# Patient Record
Sex: Male | Born: 1935 | Race: White | Hispanic: No | Smoking: Former smoker
Health system: Southern US, Community
[De-identification: ages and names within clinical notes are randomized; demographics above are authoritative.]

## PROBLEM LIST (undated history)

## (undated) DIAGNOSIS — R7303 Prediabetes: Secondary | ICD-10-CM

## (undated) DIAGNOSIS — G250 Essential tremor: Secondary | ICD-10-CM

## (undated) DIAGNOSIS — F32A Depression, unspecified: Secondary | ICD-10-CM

## (undated) DIAGNOSIS — G459 Transient cerebral ischemic attack, unspecified: Secondary | ICD-10-CM

## (undated) DIAGNOSIS — G4733 Obstructive sleep apnea (adult) (pediatric): Secondary | ICD-10-CM

## (undated) DIAGNOSIS — F329 Major depressive disorder, single episode, unspecified: Secondary | ICD-10-CM

## (undated) DIAGNOSIS — I10 Essential (primary) hypertension: Secondary | ICD-10-CM

## (undated) DIAGNOSIS — C4490 Unspecified malignant neoplasm of skin, unspecified: Secondary | ICD-10-CM

## (undated) DIAGNOSIS — F419 Anxiety disorder, unspecified: Secondary | ICD-10-CM

## (undated) DIAGNOSIS — K279 Peptic ulcer, site unspecified, unspecified as acute or chronic, without hemorrhage or perforation: Secondary | ICD-10-CM

## (undated) DIAGNOSIS — N400 Enlarged prostate without lower urinary tract symptoms: Secondary | ICD-10-CM

## (undated) DIAGNOSIS — F410 Panic disorder [episodic paroxysmal anxiety] without agoraphobia: Secondary | ICD-10-CM

## (undated) DIAGNOSIS — E039 Hypothyroidism, unspecified: Secondary | ICD-10-CM

## (undated) DIAGNOSIS — Z8673 Personal history of transient ischemic attack (TIA), and cerebral infarction without residual deficits: Secondary | ICD-10-CM

## (undated) DIAGNOSIS — R339 Retention of urine, unspecified: Secondary | ICD-10-CM

## (undated) DIAGNOSIS — E7800 Pure hypercholesterolemia, unspecified: Secondary | ICD-10-CM

## (undated) DIAGNOSIS — K219 Gastro-esophageal reflux disease without esophagitis: Secondary | ICD-10-CM

## (undated) DIAGNOSIS — I2510 Atherosclerotic heart disease of native coronary artery without angina pectoris: Secondary | ICD-10-CM

## (undated) DIAGNOSIS — K259 Gastric ulcer, unspecified as acute or chronic, without hemorrhage or perforation: Secondary | ICD-10-CM

## (undated) DIAGNOSIS — G4730 Sleep apnea, unspecified: Secondary | ICD-10-CM

## (undated) HISTORY — DX: Transient cerebral ischemic attack, unspecified: G45.9

## (undated) HISTORY — DX: Hypothyroidism, unspecified: E03.9

## (undated) HISTORY — DX: Essential (primary) hypertension: I10

## (undated) HISTORY — DX: Essential tremor: G25.0

## (undated) HISTORY — DX: Atherosclerotic heart disease of native coronary artery without angina pectoris: I25.10

## (undated) HISTORY — DX: Personal history of transient ischemic attack (TIA), and cerebral infarction without residual deficits: Z86.73

## (undated) HISTORY — DX: Major depressive disorder, single episode, unspecified: F32.9

## (undated) HISTORY — DX: Retention of urine, unspecified: R33.9

## (undated) HISTORY — PX: CORONARY ARTERY BYPASS GRAFT: SHX141

## (undated) HISTORY — DX: Anxiety disorder, unspecified: F41.9

## (undated) HISTORY — DX: Panic disorder (episodic paroxysmal anxiety): F41.0

## (undated) HISTORY — DX: Pure hypercholesterolemia, unspecified: E78.00

## (undated) HISTORY — DX: Depression, unspecified: F32.A

## (undated) HISTORY — DX: Sleep apnea, unspecified: G47.30

## (undated) HISTORY — PX: SQUAMOUS CELL CARCINOMA EXCISION: SHX2433

## (undated) HISTORY — DX: Benign prostatic hyperplasia without lower urinary tract symptoms: N40.0

## (undated) HISTORY — DX: Prediabetes: R73.03

## (undated) HISTORY — DX: Unspecified malignant neoplasm of skin, unspecified: C44.90

## (undated) HISTORY — DX: Peptic ulcer, site unspecified, unspecified as acute or chronic, without hemorrhage or perforation: K27.9

## (undated) HISTORY — DX: Obstructive sleep apnea (adult) (pediatric): G47.33

## (undated) HISTORY — PX: CAROTID ENDARTERECTOMY: SUR193

## (undated) HISTORY — DX: Gastric ulcer, unspecified as acute or chronic, without hemorrhage or perforation: K25.9

## (undated) HISTORY — DX: Gastro-esophageal reflux disease without esophagitis: K21.9

---

## 1975-11-16 HISTORY — PX: CARDIAC SURGERY: SHX584

## 2003-11-16 HISTORY — PX: CARDIAC SURGERY: SHX584

## 2005-12-17 ENCOUNTER — Ambulatory Visit: Payer: Self-pay

## 2006-04-18 ENCOUNTER — Ambulatory Visit: Payer: Self-pay | Admitting: Unknown Physician Specialty

## 2006-05-15 ENCOUNTER — Ambulatory Visit: Payer: Self-pay | Admitting: Unknown Physician Specialty

## 2006-06-15 ENCOUNTER — Ambulatory Visit: Payer: Self-pay | Admitting: Unknown Physician Specialty

## 2007-05-31 ENCOUNTER — Ambulatory Visit: Payer: Self-pay | Admitting: Cardiology

## 2008-05-14 ENCOUNTER — Ambulatory Visit: Payer: Self-pay | Admitting: Unknown Physician Specialty

## 2008-05-28 ENCOUNTER — Ambulatory Visit: Payer: Self-pay | Admitting: Unknown Physician Specialty

## 2008-06-04 ENCOUNTER — Ambulatory Visit: Payer: Self-pay | Admitting: Vascular Surgery

## 2008-07-03 ENCOUNTER — Ambulatory Visit: Payer: Self-pay | Admitting: Vascular Surgery

## 2008-07-10 ENCOUNTER — Inpatient Hospital Stay: Payer: Self-pay | Admitting: Vascular Surgery

## 2008-11-11 ENCOUNTER — Ambulatory Visit: Payer: Self-pay | Admitting: Unknown Physician Specialty

## 2008-11-11 HISTORY — PX: COLONOSCOPY: SHX174

## 2010-03-30 ENCOUNTER — Ambulatory Visit: Payer: Self-pay | Admitting: Unknown Physician Specialty

## 2013-05-07 ENCOUNTER — Encounter: Payer: Self-pay | Admitting: Family Medicine

## 2013-05-15 ENCOUNTER — Encounter: Payer: Self-pay | Admitting: Family Medicine

## 2013-06-15 ENCOUNTER — Encounter: Payer: Self-pay | Admitting: Family Medicine

## 2013-07-16 ENCOUNTER — Encounter: Payer: Self-pay | Admitting: Family Medicine

## 2013-08-15 ENCOUNTER — Encounter: Payer: Self-pay | Admitting: Family Medicine

## 2013-08-30 ENCOUNTER — Observation Stay: Payer: Self-pay | Admitting: Family Medicine

## 2013-08-30 LAB — COMPREHENSIVE METABOLIC PANEL
Alkaline Phosphatase: 85 U/L (ref 50–136)
BUN: 21 mg/dL — ABNORMAL HIGH (ref 7–18)
Bilirubin,Total: 0.4 mg/dL (ref 0.2–1.0)
Chloride: 109 mmol/L — ABNORMAL HIGH (ref 98–107)
Creatinine: 1.26 mg/dL (ref 0.60–1.30)
EGFR (African American): 59 — ABNORMAL LOW
EGFR (Non-African Amer.): 51 — ABNORMAL LOW
Glucose: 137 mg/dL — ABNORMAL HIGH (ref 65–99)
Osmolality: 285 (ref 275–301)
Potassium: 4 mmol/L (ref 3.5–5.1)
SGOT(AST): 52 U/L — ABNORMAL HIGH (ref 15–37)
SGPT (ALT): 36 U/L (ref 12–78)
Total Protein: 6.8 g/dL (ref 6.4–8.2)

## 2013-08-30 LAB — URINALYSIS, COMPLETE
Bacteria: NONE SEEN
Bacteria: NONE SEEN
Bilirubin,UR: NEGATIVE
Glucose,UR: NEGATIVE mg/dL (ref 0–75)
Glucose,UR: 50 mg/dL (ref 0–75)
Ketone: NEGATIVE
Ketone: NEGATIVE
Leukocyte Esterase: NEGATIVE
Nitrite: NEGATIVE
Ph: 7 (ref 4.5–8.0)
Ph: 7 (ref 4.5–8.0)
RBC,UR: <1 /HPF (ref 0–5)
RBC,UR: <1 /HPF (ref 0–5)
Specific Gravity: 1.01 (ref 1.003–1.030)
Squamous Epithelial: NONE SEEN
Squamous Epithelial: NONE SEEN
WBC UR: NONE SEEN /HPF (ref 0–5)
WBC UR: <1 /HPF (ref 0–5)

## 2013-08-30 LAB — CBC
HGB: 14.4 g/dL (ref 13.0–18.0)
MCH: 31.9 pg (ref 26.0–34.0)
MCHC: 35.6 g/dL (ref 32.0–36.0)
RDW: 14.4 % (ref 11.5–14.5)
WBC: 7.1 10*3/uL (ref 3.8–10.6)

## 2013-08-30 LAB — TROPONIN I: Troponin-I: <0.02 ng/mL

## 2013-08-31 LAB — BASIC METABOLIC PANEL
Anion Gap: 2 — ABNORMAL LOW (ref 7–16)
BUN: 22 mg/dL — ABNORMAL HIGH (ref 7–18)
Chloride: 108 mmol/L — ABNORMAL HIGH (ref 98–107)
EGFR (African American): 53 — ABNORMAL LOW
Osmolality: 284 (ref 275–301)
Potassium: 3.5 mmol/L (ref 3.5–5.1)

## 2013-08-31 LAB — TROPONIN I: Troponin-I: <0.02 ng/mL

## 2013-09-11 ENCOUNTER — Ambulatory Visit: Payer: Self-pay | Admitting: Ophthalmology

## 2013-11-15 DIAGNOSIS — Z8673 Personal history of transient ischemic attack (TIA), and cerebral infarction without residual deficits: Secondary | ICD-10-CM

## 2013-11-15 HISTORY — DX: Personal history of transient ischemic attack (TIA), and cerebral infarction without residual deficits: Z86.73

## 2013-11-20 ENCOUNTER — Ambulatory Visit: Payer: Self-pay | Admitting: Ophthalmology

## 2013-11-29 ENCOUNTER — Ambulatory Visit: Payer: Self-pay | Admitting: Ophthalmology

## 2014-01-30 ENCOUNTER — Ambulatory Visit: Payer: Self-pay | Admitting: Vascular Surgery

## 2014-02-07 ENCOUNTER — Encounter: Payer: Self-pay | Admitting: Neurology

## 2014-02-13 ENCOUNTER — Encounter: Payer: Self-pay | Admitting: Neurology

## 2014-02-20 DIAGNOSIS — I2510 Atherosclerotic heart disease of native coronary artery without angina pectoris: Secondary | ICD-10-CM | POA: Insufficient documentation

## 2014-02-20 DIAGNOSIS — E7800 Pure hypercholesterolemia, unspecified: Secondary | ICD-10-CM | POA: Insufficient documentation

## 2014-02-20 DIAGNOSIS — I25810 Atherosclerosis of coronary artery bypass graft(s) without angina pectoris: Secondary | ICD-10-CM | POA: Insufficient documentation

## 2014-02-20 DIAGNOSIS — I10 Essential (primary) hypertension: Secondary | ICD-10-CM | POA: Insufficient documentation

## 2014-03-15 ENCOUNTER — Encounter: Payer: Self-pay | Admitting: Neurology

## 2014-04-15 ENCOUNTER — Emergency Department: Payer: Self-pay | Admitting: Emergency Medicine

## 2014-04-15 LAB — CBC
HCT: 40.4 % (ref 40.0–52.0)
HGB: 13.6 g/dL (ref 13.0–18.0)
MCH: 30.4 pg (ref 26.0–34.0)
MCHC: 33.6 g/dL (ref 32.0–36.0)
MCV: 90 fL (ref 80–100)
Platelet: 148 10*3/uL — ABNORMAL LOW (ref 150–440)
RBC: 4.47 10*6/uL (ref 4.40–5.90)
RDW: 14.4 % (ref 11.5–14.5)
WBC: 6.6 10*3/uL (ref 3.8–10.6)

## 2014-04-15 LAB — BASIC METABOLIC PANEL
Anion Gap: 5 — ABNORMAL LOW (ref 7–16)
BUN: 32 mg/dL — ABNORMAL HIGH (ref 7–18)
CO2: 29 mmol/L (ref 21–32)
Calcium, Total: 9.2 mg/dL (ref 8.5–10.1)
Chloride: 107 mmol/L (ref 98–107)
Creatinine: 1.68 mg/dL — ABNORMAL HIGH (ref 0.60–1.30)
EGFR (African American): 41 — ABNORMAL LOW
EGFR (Non-African Amer.): 36 — ABNORMAL LOW
GLUCOSE: 122 mg/dL — AB (ref 65–99)
Osmolality: 289 (ref 275–301)
POTASSIUM: 4 mmol/L (ref 3.5–5.1)
Sodium: 141 mmol/L (ref 136–145)

## 2014-04-15 LAB — TROPONIN I: Troponin-I: <0.02 ng/mL

## 2014-04-15 LAB — PRO B NATRIURETIC PEPTIDE: B-TYPE NATIURETIC PEPTID: 162 pg/mL (ref 0–450)

## 2014-04-16 ENCOUNTER — Encounter: Payer: Self-pay | Admitting: Neurology

## 2014-09-25 DIAGNOSIS — N183 Chronic kidney disease, stage 3 unspecified: Secondary | ICD-10-CM | POA: Insufficient documentation

## 2015-02-15 DIAGNOSIS — F419 Anxiety disorder, unspecified: Secondary | ICD-10-CM

## 2015-02-15 DIAGNOSIS — F329 Major depressive disorder, single episode, unspecified: Secondary | ICD-10-CM | POA: Insufficient documentation

## 2015-02-15 DIAGNOSIS — N138 Other obstructive and reflux uropathy: Secondary | ICD-10-CM | POA: Insufficient documentation

## 2015-02-15 DIAGNOSIS — G939 Disorder of brain, unspecified: Secondary | ICD-10-CM | POA: Insufficient documentation

## 2015-02-15 DIAGNOSIS — R339 Retention of urine, unspecified: Secondary | ICD-10-CM | POA: Insufficient documentation

## 2015-02-15 DIAGNOSIS — G4730 Sleep apnea, unspecified: Secondary | ICD-10-CM | POA: Insufficient documentation

## 2015-02-15 DIAGNOSIS — N401 Enlarged prostate with lower urinary tract symptoms: Secondary | ICD-10-CM

## 2015-02-15 DIAGNOSIS — K219 Gastro-esophageal reflux disease without esophagitis: Secondary | ICD-10-CM | POA: Insufficient documentation

## 2015-02-15 DIAGNOSIS — I6782 Cerebral ischemia: Secondary | ICD-10-CM | POA: Insufficient documentation

## 2015-02-15 DIAGNOSIS — K259 Gastric ulcer, unspecified as acute or chronic, without hemorrhage or perforation: Secondary | ICD-10-CM | POA: Insufficient documentation

## 2015-02-15 DIAGNOSIS — C4490 Unspecified malignant neoplasm of skin, unspecified: Secondary | ICD-10-CM | POA: Insufficient documentation

## 2015-02-26 DIAGNOSIS — H539 Unspecified visual disturbance: Secondary | ICD-10-CM | POA: Insufficient documentation

## 2015-02-26 DIAGNOSIS — I69998 Other sequelae following unspecified cerebrovascular disease: Secondary | ICD-10-CM

## 2015-03-04 ENCOUNTER — Inpatient Hospital Stay
Admit: 2015-03-04 | Disposition: A | Discharge status: Home / Self Care | Payer: Self-pay | Attending: Internal Medicine | Admitting: Internal Medicine

## 2015-03-04 LAB — BASIC METABOLIC PANEL
ANION GAP: 4 — AB (ref 7–16)
BUN: 42 mg/dL — ABNORMAL HIGH
CALCIUM: 9.5 mg/dL
Chloride: 105 mmol/L
Co2: 29 mmol/L
Creatinine: 2.11 mg/dL — ABNORMAL HIGH
EGFR (African American): 31 — ABNORMAL LOW
EGFR (Non-African Amer.): 27 — ABNORMAL LOW
Glucose: 163 mg/dL — ABNORMAL HIGH
POTASSIUM: 3.7 mmol/L
SODIUM: 138 mmol/L

## 2015-03-04 LAB — URINALYSIS, COMPLETE
BACTERIA: NONE SEEN
BILIRUBIN, UR: NEGATIVE
Blood: NEGATIVE
GLUCOSE, UR: NEGATIVE mg/dL (ref 0–75)
Ketone: NEGATIVE
LEUKOCYTE ESTERASE: NEGATIVE
Nitrite: NEGATIVE
Ph: 5 (ref 4.5–8.0)
Protein: 30
Specific Gravity: 1.009 (ref 1.003–1.030)
Squamous Epithelial: NONE SEEN
WBC UR: NONE SEEN /HPF (ref 0–5)

## 2015-03-04 LAB — CBC WITH DIFFERENTIAL/PLATELET
BASOS PCT: 0.6 %
Basophil #: 0.1 10*3/uL (ref 0.0–0.1)
EOS PCT: 5.6 %
Eosinophil #: 0.5 10*3/uL (ref 0.0–0.7)
HCT: 38.4 % — AB (ref 40.0–52.0)
HGB: 12.9 g/dL — AB (ref 13.0–18.0)
Lymphocyte #: 1.3 10*3/uL (ref 1.0–3.6)
Lymphocyte %: 15.5 %
MCH: 30.4 pg (ref 26.0–34.0)
MCHC: 33.7 g/dL (ref 32.0–36.0)
MCV: 90 fL (ref 80–100)
Monocyte #: 0.8 x10 3/mm (ref 0.2–1.0)
Monocyte %: 9.5 %
NEUTROS PCT: 68.8 %
Neutrophil #: 5.7 10*3/uL (ref 1.4–6.5)
Platelet: 172 10*3/uL (ref 150–440)
RBC: 4.25 10*6/uL — ABNORMAL LOW (ref 4.40–5.90)
RDW: 14.1 % (ref 11.5–14.5)
WBC: 8.3 10*3/uL (ref 3.8–10.6)

## 2015-03-04 LAB — TROPONIN I

## 2015-03-07 NOTE — H&P (Signed)
PATIENT NAME:  Cody Blackburn, Cody Blackburn MR#:  [id number] DATE OF BIRTH:  11-Jul-1923  DATE OF ADMISSION:  08/30/2013  PRIMARY CARE PHYSICIAN:  Dr. Dion Body.   CHIEF COMPLAINT:  Confusion.   HISTORY OF PRESENT ILLNESS:  An 81 year old male with history of coronary artery disease with prior bypass surgery with peripheral vascular disease, anxiety, hypertension, who reports he has had increasing social stressors recently. He has had some trouble with a persistent cough, although this has actually improved recently. He states that due to his recent stresses, he has actually not been taking his medications quite as regularly. This morning, he was apparently talking to his family members when he abruptly seemed to "space out." He denies loss of consciousness. He was not able to think of what he was trying to say, felt like he was having trouble responding, though denies numbness, weakness on one side or the other. This episode lasted for about 15 minutes. After that, he felt "strange," still feeling a little bit off now, but appears that he is close to what would be baseline for him. He reports history of TIA, somewhat similar symptoms in the past related to increased stress. He reports that his systolic pressure was 295 earlier, was found to be 211 when he came into the Emergency Room. Labs were unrevealing except for a significant amount of proteinuria. CT revealed small vessel changes without acute changes. Neurologic exam was fairly nonfocal. He is admitted now with altered mental status, accelerated hypertension, possible TIA.   PAST MEDICAL HISTORY: 1.  Coronary artery disease with bypass surgery in 1997.  2.  Peptic ulcer disease.  3.  Anxiety with depression.  4.  Hyperlipidemia.  5.  Hypertension.  6.  Benign prostatic hypertrophy.  7.  Hyperglycemia.  8.  History of sleep apnea.  9.  Essential tremor.  10.  Peripheral vascular disease with prior right carotid endarterectomy.   ALLERGIES:  BAYCOL, SIMVASTATIN, PERCOCET.   MEDICATIONS: 1.  Inderal 10 mg p.o. b.i.d.  2.  Aspirin 81 mg p.o. daily.  3.  Multivitamin 1 p.o. daily.  4.  Claritin 10 mg p.o. daily.  5.  Flomax 0.4 mg p.o. at bedtime.  6.  Flonase 2 sprays each nostril daily.  7.  Levothyroxine 0.088 mg p.o. daily.  8.  Omeprazole 20 mg p.o. daily.  9.  Pravachol 40 mg p.o. at bedtime.  10.  Fish oil 1000 mg p.o. daily.  11.  CoQ10 100 mg p.o. b.i.d.  12.  Saw palmetto 1 p.o. daily.  13.  Amlodipine 10 mg p.o. daily.   SOCIAL HISTORY:  No tobacco. Rare alcohol.   FAMILY HISTORY:  Stroke, abdominal aortic aneurysm, lymphoma.   REVIEW OF SYSTEMS:  Please see HPI. Denies fevers, chills, night sweats. No headache. No vision changes. No bowel or bladder incontinence. The remainder of complete review of systems is negative.   PHYSICAL EXAMINATION: VITAL SIGNS:  Temperature 97.9, pulse 73, blood pressure 140/85 currently. Weight 85 kg.  GENERAL:  Elderly male, in no distress.  EYES:  Pupils round and reactive to light. Extraocular motion intact.  EARS, NOSE AND THROAT:  External examination unremarkable. The oropharynx is moist without lesions.  NECK:  Supple, trachea midline. No thyromegaly.  HEART:  Regular rate and rhythm with 2/6 systolic murmur. No gallops, rubs.  LUNGS:  Clear bilaterally without wheeze or retraction. Saturation 98% on room air.  ABDOMEN:  Soft, nontender, nondistended. Positive bowel sounds. No guard or rebound.  SKIN:  No  significant rashes or nodules.  LYMPH NODES:  No cervical or supraclavicular nodes.  MUSCULOSKELETAL:  No clubbing or cyanosis. No significant edema.  NEUROLOGIC:  Cranial nerves appear to be intact. No pronator drift. Finger-to-nose is intact. Motor strength appears symmetrical in the lower extremities. Gait is not tested.   LABORATORY, DIAGNOSTIC, AND RADIOLOGICAL DATA:  EKG reveals sinus rhythm with right bundle branch block and left ventricular hypertrophy. Urinalysis  with greater than 500 mg/dL of protein, otherwise unrevealing. White count is 7.1 with hemoglobin 14.4 and platelets 209. Glucose 137, BUN 21, creatinine 1.26. Cardiac enzymes negative. CT head without contrast revealing small vessel changes without acute abnormality.   IMPRESSION AND PLAN: 1.  Altered mental status/accelerated hypertension/possible transient ischemic attack. We will place under observation, neurologic status to be checked every 4 hours. Resume home medications and monitor blood pressure, allowing blood pressure to run slightly higher than normal, given possibility of acute neurologic changes. Echocardiogram in a.m. Carotid Dopplers to evaluate any progression, particularly the left-side carotid artery. He has been prescribed some antigravity medications in the past but has not started these, and so we will hold on this for now.   Above plan of action was discussed with the patient, and he is in agreement.     ____________________________ Adin Hector, MD bjk:dmm D: 08/30/2013 19:58:46 ET T: 08/30/2013 20:28:48 ET JOB#: [id number]  cc: Adin Hector, MD, <Dictator> Dion Body, MD Ramonita Lab MD ELECTRONICALLY SIGNED 09/07/2013 12:36

## 2015-03-07 NOTE — Discharge Summary (Signed)
PATIENT NAME:  Cody Blackburn, Cody Blackburn MR#:  [id number] DATE OF BIRTH:  08/17/1923  DATE OF ADMISSION:  08/30/2013 DATE OF DISCHARGE:  08/31/2013  DISCHARGE DIAGNOSES: 1. Altered mental size/possible transient ischemic attack, that is resolved.  2. Hypertension.  3. Coronary artery disease.   DISCHARGE MEDICATIONS: Unchanged from HPI and home regimen.   CONSULTS: None.   PROCEDURES AND STUDIES: The patient underwent carotid study, which showed no acute stenosis. Also underwent  echocardiogram and CT, both negative.   BRIEF HOSPITAL COURSE:  Acute mental status changes versus transient ischemic attack. The patient initially came in accelerated hypertension concerning for possible transient ischemic attack with changes in his mental status. His blood pressure was down to the 160s and his symptoms spontaneously resolved. He had no further issues with that.  CT of the head were negative. Also underwent a complete work-up echocardiogram and carotid studies and showed no acute abnormalities. He was at his baseline state,  was able to walk and ambulate without symptoms. Therefore, he was discharged the following day after his studies were completed. We will follow up with Dr. Netty Starring as scheduled.   ____________________________ Dion Body, MD kl:sg D: 09/03/2013 12:07:01 ET T: 09/03/2013 12:47:09 ET JOB#: [id number]  cc: Dion Body, MD, <Dictator> Dion Body MD ELECTRONICALLY SIGNED 09/25/2013 16:53

## 2015-03-07 NOTE — Op Note (Signed)
PATIENT NAME:  Cody Blackburn, Cody Blackburn MR#:  [id number] DATE OF BIRTH:  1923-01-11  DATE OF PROCEDURE:  09/11/2013  PREOPERATIVE DIAGNOSIS: Visually significant cataract of the right eye.   POSTOPERATIVE DIAGNOSIS: Visually significant cataract of the right eye.   OPERATIVE PROCEDURE: Cataract extraction by phacoemulsification with implant of intraocular lens to right eye.   SURGEON: Birder Robson, MD.   ANESTHESIA:  1. Managed anesthesia care.  2. Topical tetracaine drops followed by 2% Xylocaine jelly applied in the preoperative holding area.   COMPLICATIONS: None.   TECHNIQUE:  Stop and chop.   DESCRIPTION OF PROCEDURE: The patient was examined and consented in the preoperative holding area where the aforementioned topical anesthesia was applied to the right eye and then brought back to the Operating Room where the right eye was prepped and draped in the usual sterile ophthalmic fashion and a lid speculum was placed. A paracentesis was created with the side port blade and the anterior chamber was filled with viscoelastic. A near clear corneal incision was performed with the steel keratome. A continuous curvilinear capsulorrhexis was performed with a cystotome followed by the capsulorrhexis forceps. Hydrodissection and hydrodelineation were carried out with BSS on a blunt cannula. The lens was removed in a stop and chop technique and the remaining cortical material was removed with the irrigation-aspiration handpiece. The capsular bag was inflated with viscoelastic and the Tecnis ZCB00 19.5-diopter lens, serial number [id number] was placed in the capsular bag without complication. The remaining viscoelastic was removed from the eye with the irrigation-aspiration handpiece. The wounds were hydrated. The anterior chamber was flushed with Miostat and the eye was inflated to physiologic pressure. 0.1 mL of cefuroxime concentration 10 mg/mL was placed in the anterior chamber. The wounds were found to be  water tight. The eye was dressed with Vigamox. The patient was given protective glasses to wear throughout the day and a shield with which to sleep tonight. The patient was also given drops with which to begin a drop regimen today and will follow-up with me in one day.    ____________________________ Livingston Diones. Docie Abramovich, MD wlp:dmm D: 09/11/2013 18:37:40 ET T: 09/11/2013 19:49:16 ET JOB#: [id number]  cc: Kalief Kattner L. Ardean Melroy, MD, <Dictator> Livingston Diones Luken Shadowens MD ELECTRONICALLY SIGNED 09/17/2013 10:30

## 2015-03-08 NOTE — Op Note (Signed)
PATIENT NAME:  Cody Blackburn, Cody Blackburn MR#:  [id number] DATE OF BIRTH:  Aug 26, 1923  DATE OF PROCEDURE:  11/20/2013  PREOPERATIVE DIAGNOSIS: Visually significant cataract of the left eye.   POSTOPERATIVE DIAGNOSIS: Visually significant cataract of the left eye.   OPERATIVE PROCEDURE: Cataract extraction by phacoemulsification with implant of intraocular lens to left eye.   SURGEON: Birder Robson, MD.   ANESTHESIA:  1. Managed anesthesia care.  2. Topical tetracaine drops followed by 2% Xylocaine jelly applied in the preoperative holding area.   COMPLICATIONS: None.   TECHNIQUE:  Stop and chop.  DESCRIPTION OF PROCEDURE: The patient was examined and consented in the preoperative holding area where the aforementioned topical anesthesia was applied to the left eye and then brought back to the Operating Room where the left eye was prepped and draped in the usual sterile ophthalmic fashion and a lid speculum was placed. A paracentesis was created with the side port blade and the anterior chamber was filled with viscoelastic. A near clear corneal incision was performed with the steel keratome. A continuous curvilinear capsulorrhexis was performed with a cystotome followed by the capsulorrhexis forceps. Hydrodissection and hydrodelineation were carried out with BSS on a blunt cannula. The lens was removed in a stop and chop technique and the remaining cortical material was removed with the irrigation-aspiration handpiece. The capsular bag was inflated with viscoelastic and the Tecnis ZCB00 lens, serial number [id number] was placed in the capsular bag without complication. The remaining viscoelastic was removed from the eye with the irrigation-aspiration handpiece. The wounds were hydrated. The anterior chamber was flushed with Miostat and the eye was inflated to physiologic pressure. 0.1 mL of cefuroxime concentration 10 mg/mL was placed in the anterior chamber. The wounds were found to be water tight. The eye  was dressed with Vigamox. The patient was given protective glasses to wear throughout the day and a shield with which to sleep tonight. The patient was also given drops with which to begin a drop regimen today and will follow up with me in one day.      ____________________________ Livingston Diones. Azlynn Mitnick, MD wlp:dmm D: 11/29/2013 16:50:18 ET T: 11/29/2013 22:06:49 ET JOB#: [id number]  cc: Rollin Kotowski L. Kamri Gotsch, MD, <Dictator> Livingston Diones Austen Oyster MD ELECTRONICALLY SIGNED 12/20/2013 13:28

## 2015-03-16 NOTE — Discharge Summary (Signed)
PATIENT NAME:  Cody Blackburn, Cody Blackburn MR#:  [id number] DATE OF BIRTH:  11/03/1923  DATE OF ADMISSION:  03/04/2015 DATE OF DISCHARGE:  03/04/2015  ADMISSION DIAGNOSIS:  Dizziness.   DISCHARGE DIAGNOSES:   1.  Dizziness, likely secondary to elevated blood pressure.  No evidence of stroke on MRI.  2.  Chronic kidney disease, stage III.    CONSULTATIONS:  None.   LABORATORY, EKG AND IMAGING DATA AT DISCHARGE:  White blood cells 8.3, hemoglobin 13, hematocrit 39, platelets 172,000.  Sodium 138, potassium 3.7, chloride 105, bicarbonate 29, BUN 42, creatinine 2.11, glucose 163, and calcium 9.5.   A 2-D echocardiogram showed an EF of 60 to 65% with mild-to-moderate tricuspid regurgitation.   MRI of the brain showed no evidence of acute stroke.  Carotid ultrasound showed no hemodynamically significant stenosis.   PHYSICAL EXAMINATION AT DISCHARGE:  VITAL SIGNS:  Temperature 97.8, pulse 72, respirations 17, blood pressure 160/75, saturation 96% on room air.  GENERAL:  The patient is alert and oriented in no acute distress.  CARDIOVASCULAR:  Regular rate and rhythm.  There is a 2/6 systolic ejection murmur heard best at the right sternal border without radiation.  LUNGS:  Clear to auscultation without crackles, rales, rhonchi or wheezing.  NEUROLOGIC:  Cranial nerves II-XII are intact.  No focal deficits.  No ataxia.   HOSPITAL COURSE:  A very pleasant 81 year old male who presented with dizziness and found to have elevated blood pressure.  For further details, please refer to the H and P.  1.  Dizziness, likely secondary to accelerated hypertension.  The patient had no dizziness while in the hospital.  He underwent a stroke workup which essentially was negative.  I suspect his dizziness was from his blood pressure being acutely elevated  due to anxiety as per the patient's report.  2.  Essential hypertension.  The patient will need close followup with his primary care physician. It sounds like him and Dr.  Netty Starring have been working diligently on controlling patient's blood pressure.  The patient will followup this week with Dr. Netty Starring.   3.  Chronic kidney disease, stage III.  His creatinine was stable.  4.  History of CAD, status post CABG.  No acute cardiac events.  Troponin was negative.  5.  Hyperlipidemia.  The patient will continue statin. 6.  Hypothyroidism.  The patient will continue on Synthroid.  7.  History sleep apnea.  The patient was continue with CPAP.   DISCHARGE MEDICATIONS:  1.  Norvasc 5 mg 2 tablets in the morning.  2.  Inderal 10 mg b.i.d.  3.  Synthroid 88 mcg daily.  4.  Simvastatin 40 mg daily.   5.  Flonase one spray daily.  6.  Flomax 0.4 mg daily.  7.  Claritin 10 mg daily.  8.  Centrum Silver daily.  9.  COQ 10 100 mg daily.  10. Fish oil 1 tablet daily.  11. Aspirin 81 mg daily.  12. Prilosec 40 mg daily.   DISCHARGE DIET:  Low sodium.   DISCHARGE ACTIVITY:  As tolerated.  DISCHARGE FOLLOWUP:  The patient will follow up with Dr. Netty Starring this week.    TIME SPENT:  Approximately 35 minutes.  Plan of care was discussed with the patient and family.  The patient was stable for discharge.    ___________________________ Kida Digiulio P. Benjie Karvonen, MD [id number] D: 03/04/2015 13:02:01 ET T: 03/04/2015 14:27:07 ET JOB#: [id number]  cc: Quintara Bost P. Benjie Karvonen, MD, <Dictator> Donell Beers Arlinda Barcelona MD ELECTRONICALLY SIGNED 03/05/2015 21:31

## 2015-03-16 NOTE — H&P (Signed)
PATIENT NAME:  Cody Blackburn, Cody Blackburn MR#:  [id number] DATE OF BIRTH:  1923-01-24  DATE OF ADMISSION:  03/04/2015  REFERRING DOCTOR:  Valli Glance. Owens Shark, MD   PRIMARY CARE PRACTITIONER:  Dion Body, MD, of [facility] clinic.   ADMITTING PHYSICIAN:  Juluis Mire, MD   CHIEF COMPLAINT:  Dizziness with unsteady gait.   HISTORY OF PRESENT ILLNESS:  An 81 year old Caucasian male with history of multiple medical problems including cerebellar stroke, coronary artery disease status post CABG, hypertension, CKD stage III, who presents with the complaints of dizziness associated with unsteady gait that worsened yesterday, which is concerning for a stroke, hence came to the Emergency Room for further evaluation. The patient stated that he has chronic dizziness with some gait problems following his cerebellar stroke in March 2015, but yesterday he felt increased dizziness with unsteady gait and also had some lightheadedness with an episode of almost passing out but did not lose consciousness, hence came to the Emergency Room for further evaluation. Denies any chest pain, shortness of breath, palpitations, or loss of consciousness. Denies any focal weakness or numbness. No history of any cough or fever. No nausea, vomiting, diarrhea, or constipation. He does have some mild headaches. No visual disturbances. No speech or swallowing difficulties.   In the Emergency Room, the patient was evaluated by the ED physician and found to be with stable vitals and unremarkable examination and underwent an evaluation, which showed stable creatinine at 2.41 and CT of the head was negative for any acute intracerebral pathology. In view of his symptoms concerning for a possible CVA, which the symptoms were similar to the prior CVA, the hospitalist service was consulted for further evaluation and management. At the current time, the patient is comfortably lying in the bed and denies any complaints such as dizziness, focal weakness or  numbness.    PAST MEDICAL HISTORY:  1.  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  2.  Hypertension.  3.  Coronary artery disease status post CABG.  4.  Hypothyroidism.  5.  Hyperlipidemia.  6.  Chronic kidney disease stage III.  7.  Essential tremor.  8.  Peripheral vascular disease status post right carotid endarterectomy.  9.  Sleep apnea on CPAP.  10.  Borderline diabetes mellitus.  11.  Peptic ulcer disease.  12.  Anxiety/depression.   PAST SURGICAL HISTORY: 1.  CABG.  2.  Right carotid endarterectomy.   ALLERGIES:  TAPE.   FAMILY HISTORY:  Significant for CVA, abdominal aortic aneurysm, and lymphoma.   SOCIAL HISTORY:  He is widowed and lives at home. The family lives with him. Denies any history of smoking. He does take alcohol on and off on social occasions. Denies any substance abuse.    HOME MEDICATIONS:  1.  Amlodipine 5 mg 2 tablets orally once a day in the morning.  2.  Aspirin 81 mg 1 tablet orally once a day.  3.  Centrum Silver 1 tablet orally once a day.  4.  Claritin 10 mg 1 tablet orally once a day.  5.  CoQ10, 100 mg 1 capsule orally once a day.  6.  Fish oil oral capsule 1 capsule orally once a day.  7.  Flomax 0.4 mg 1 capsule orally once a day.  8.  Flonase nasal spray 1 spray in each nostril once a day.  9.  Inderal tablet 10 mg 1 tablet orally 2 times a day.  10.  Prilosec 40 mg 1 tablet orally once a day.  11.  Simvastatin 40 mg 1 tablet orally once a day.  12.  Synthroid 88 mcg 1 tablet orally once a day.    REVIEW OF SYSTEMS: CONSTITUTIONAL:  Negative for fever, chills, fatigue, or generalized weakness.  EYES:  Negative for blurred vision or double vision. No pain or redness. No discharge. EARS, NOSE, AND THROAT:  Negative for tinnitus, ear pain, hearing loss, epistaxis, nasal discharge, or difficulty swallowing.  RESPIRATORY:  Negative for cough, wheezing, dyspnea, hemoptysis, or painful respiration.  CARDIOVASCULAR:  Negative for chest pain,  palpitations, orthopnea, dyspnea on exertion, or pedal edema. No syncopal episodes. She does have some chronic dizziness, which increased yesterday.  GASTROINTESTINAL:  Negative for nausea, vomiting, diarrhea, constipation, abdominal pain, hematemesis, melena, or rectal bleeding.  GENITOURINARY:  Negative for dysuria, frequency, urgency, or hematuria.  ENDOCRINE:  Negative for polyuria, nocturia, or heat or cold intolerance.  HEMATOLOGIC AND LYMPHATIC:  Negative for anemia, easy bruising or bleeding, or swollen glands.  INTEGUMENTARY:  Negative for acne, skin rash, or lesions.  MUSCULOSKELETAL:  Negative for neck or back pain.  NEUROLOGICAL:  Negative for focal weakness or numbness, but complains of dizziness with ataxia as mentioned in the history of present illness. The patient has a history of prior cerebellar stroke in March 2015.  PSYCHIATRIC:  Positive for history of anxiety/depression, stable on p.r.n. medications.   PHYSICAL EXAMINATION: VITAL SIGNS:  Temperature 97.9 degrees Fahrenheit, pulse rate 62 per minute, respirations 18 per minute, blood pressure 156/59, and O2 saturation 97% on room air.  GENERAL:  Well developed, well nourished, alert, in no acute distress, comfortably resting in the bed.  HEAD:  Atraumatic, normocephalic.  EYES:  Pupils are equal and reactive to light and accommodation. No conjunctival pallor. No icterus. Extraocular movements are intact.  NOSE:  No drainage. No lesions.  EARS:  No drainage. No external lesions. ORAL cavity:  No mucosal lesions. No exudates.  NECK:  Supple. No JVD. No thyromegaly. No carotid bruit. Range of motion of the neck is within normal limits.  RESPIRATORY:  Good respiratory effort. Not using accessory muscles of respiration. Bilateral vesicular breath sounds. No rales or rhonchi.  CARDIOVASCULAR:  S1 and S2, regular. No murmurs, gallops, or clicks. Pulses are equal at carotid, femoral, and pedal pulses. No peripheral edema.   GASTROINTESTINAL:  Abdomen is soft and nontender. No hepatosplenomegaly. No masses noted. No guarding. Bowel sounds are present and equal in all 4 quadrants.  GENITOURINARY:  Deferred.   MUSCULOSKELETAL:  No joint tenderness or effusion. Range of motion is adequate. Strength and tone are equal bilaterally.  SKIN:  Inspection is within normal limits.  LYMPHATIC:  No cervical lymphadenopathy.  VASCULAR:  Good dorsalis pedis and posterior tibial pulses.  NEUROLOGICAL:  Alert, awake, and oriented x 3. Cranial nerves II through XII are grossly intact. No sensory deficit. Motor strength is 5/5 in both upper and lower extremities. DTRs are 2+ bilaterally and symmetrical. Plantars are downgoing.  PSYCHIATRIC:  Alert, awake, and oriented x 3. Judgment and insight are adequate. Memory and mood are within normal limits.   ANCILLARY DATA:  LABORATORY DATA:  Serum glucose is 163, BUN 42, creatinine 2.11, sodium 138, potassium 3.7, chloride 105, bicarbonate 29, and calcium is 9.5. Troponin is less than 0.03. WBC is 8.3, hemoglobin 12.9, hematocrit 38.4, and platelet count 172,000. Urinalysis is unremarkable.   CT of the head, noncontrast study:  No definite CT evidence of an acute intracranial abnormality.   EKG:  Normal sinus rhythm with  ventricular rate of 70 beats per minute and right bundle branch block.   ASSESSMENT AND PLAN:  An 81 year old Caucasian male with a history of multiple medical problems including prior cerebellar cerebrovascular accident in March 2015, who presents with dizziness and ataxia concerning for cerebrovascular accident. CT of the head was negative.   1.  Dizziness with ataxia concerning for cerebrovascular accident in a patient with a history of prior cerebellar stroke in March 2015. Plan:  Admit to telemetry, neurologic watch, keep him n.p.o. until swallow evaluation, aspirin. We will request an MRI, carotid Doppler, echocardiogram, and neurology consultation. Physical therapy  consultation is requested to help assist in ambulation and gait training.  2.  History of prior cerebellar stroke in March 2015.  3.  Coronary artery disease status post coronary artery bypass graft, stable. No acute cardiac events now. Continue home medications.  4.  Hypertension, stable on home medications. Continue same.  5.  Hyperlipidemia, on statin. Continue same.  6.  Chronic kidney disease, stage III. Creatinine is stable at 2.11. Monitor.   7.  Hypothyroidism, stable on home medications.  8.  Sleep apnea on CPAP, stable.  9.  Borderline diabetes mellitus, not on any medications, stable clinically. We will place sliding scale insulin and follow blood sugars.  10.  Essential tremor, stable on Inderal. Continue same.  11.  Deep vein thrombosis prophylaxis with subcutaneous heparin.  12.  Gastrointestinal prophylaxis with proton pump inhibitor.   CODE STATUS:  Full code.   TIME SPENT:  50 minutes.   ____________________________ Juluis Mire, MD enr:nb D: 03/04/2015 05:48:17 ET T: 03/04/2015 06:47:03 ET JOB#: [id number]  cc: Juluis Mire, MD, <Dictator> Dion Body, MD Juluis Mire MD ELECTRONICALLY SIGNED 03/04/2015 19:24

## 2015-03-18 DIAGNOSIS — R42 Dizziness and giddiness: Secondary | ICD-10-CM | POA: Insufficient documentation

## 2015-04-04 ENCOUNTER — Encounter: Payer: Self-pay | Admitting: *Deleted

## 2015-04-18 ENCOUNTER — Encounter: Payer: Self-pay | Admitting: Urology

## 2015-04-18 ENCOUNTER — Ambulatory Visit (INDEPENDENT_AMBULATORY_CARE_PROVIDER_SITE_OTHER): Payer: Medicare Other | Admitting: Urology

## 2015-04-18 VITALS — BP 152/72 | HR 67 | Resp 18 | Ht 72.0 in | Wt 193.2 lb

## 2015-04-18 DIAGNOSIS — G4733 Obstructive sleep apnea (adult) (pediatric): Secondary | ICD-10-CM | POA: Insufficient documentation

## 2015-04-18 DIAGNOSIS — N138 Other obstructive and reflux uropathy: Secondary | ICD-10-CM

## 2015-04-18 DIAGNOSIS — N183 Chronic kidney disease, stage 3 unspecified: Secondary | ICD-10-CM

## 2015-04-18 DIAGNOSIS — Z8673 Personal history of transient ischemic attack (TIA), and cerebral infarction without residual deficits: Secondary | ICD-10-CM | POA: Insufficient documentation

## 2015-04-18 DIAGNOSIS — R339 Retention of urine, unspecified: Secondary | ICD-10-CM | POA: Diagnosis not present

## 2015-04-18 DIAGNOSIS — N401 Enlarged prostate with lower urinary tract symptoms: Secondary | ICD-10-CM

## 2015-04-18 DIAGNOSIS — F410 Panic disorder [episodic paroxysmal anxiety] without agoraphobia: Secondary | ICD-10-CM | POA: Insufficient documentation

## 2015-04-18 DIAGNOSIS — R7303 Prediabetes: Secondary | ICD-10-CM | POA: Insufficient documentation

## 2015-04-18 DIAGNOSIS — K279 Peptic ulcer, site unspecified, unspecified as acute or chronic, without hemorrhage or perforation: Secondary | ICD-10-CM | POA: Insufficient documentation

## 2015-04-18 DIAGNOSIS — G250 Essential tremor: Secondary | ICD-10-CM | POA: Insufficient documentation

## 2015-04-18 DIAGNOSIS — E039 Hypothyroidism, unspecified: Secondary | ICD-10-CM | POA: Insufficient documentation

## 2015-04-18 DIAGNOSIS — F32A Depression, unspecified: Secondary | ICD-10-CM | POA: Insufficient documentation

## 2015-04-18 DIAGNOSIS — F329 Major depressive disorder, single episode, unspecified: Secondary | ICD-10-CM | POA: Insufficient documentation

## 2015-04-18 LAB — BLADDER SCAN AMB NON-IMAGING

## 2015-04-18 NOTE — Progress Notes (Signed)
04/18/2015 4:47 PM   Cody Blackburn [birth year] [id number]  Referring provider: No referring provider defined for this encounter.  Chief Complaint  Patient presents with  . Benign Prostatic Hypertrophy    HPI: Mr. Angerer is an 81 year old male with a long-standing history of BPH and voiding issues initially seen and evaluated in 12/15.  He returns today for routine follow-up.  He is currently on Flomax,  saw palmetto, and started on finasteride at last visit. He did have a slightly elevated PVR last visit  at 125 cc as well as an enlarged gland consistent with BPH on rectal exam.   Overall today, he reports that he is doing much better since starting the finasteride. He reports that his urinary stream has improved in caliber and he no longer has postvoid   Dribbling. He also feels that he is doing a better job of emptying his bladder. He continues to need to double void on a frequent basis.  Previous IPSS 23/3 in 12/15.  IPSS today 32/2.    He does have baseline renal dysfunction, creatinine 1.8 on 11/15 which is up from 1.3 in March 2014. Unfortunately, his most recent creatinine was 2.11 on 4/16.  His most recent PSA was checked in March 2014 which is 3.49.  DRE enlarged 10/2014 50 cc without nodules.    He denies any urinary tract infections, dysuria, or history of bladder stones. He was formerly a patient of Dr. Bernardo Heater but has not seen a urologist in more than 10 years. He does have a history of undergoing prostate biopsy in the remote past which per patient report was negative.    PMH: Past Medical History  Diagnosis Date  . GERD (gastroesophageal reflux disease)   . Anxiety and depression   . Skin cancer   . Sleep apnea   . Stomach ulcer   . TIA (transient ischemic attack)   . BPH (benign prostatic hyperplasia)   . Incomplete bladder emptying     Surgical History: Past Surgical History  Procedure Laterality Date  . Cardiac surgery  2005  . Cardiac  surgery  1977    Home Medications:        Medication List       This list is accurate as of: 04/18/15  4:47 PM.  Always use your most recent med list.               amLODipine 5 MG tablet  Commonly known as:  NORVASC  Take by mouth.     carvedilol 3.125 MG tablet  Commonly known as:  COREG  Take by mouth.     Co Q 10 100 MG Caps  Take by mouth.     Docusate Sodium 100 MG capsule  Take by mouth.     finasteride 5 MG tablet  Commonly known as:  PROSCAR  Take by mouth.     fluticasone 50 MCG/ACT nasal spray  Commonly known as:  FLONASE  Place into the nose.     hydrochlorothiazide 12.5 MG tablet  Commonly known as:  HYDRODIURIL  Take by mouth.     levothyroxine 88 MCG tablet  Commonly known as:  SYNTHROID, LEVOTHROID  Take by mouth.     loratadine 10 MG tablet  Commonly known as:  CLARITIN  Take by mouth.     losartan 50 MG tablet  Commonly known as:  COZAAR  Take by mouth.     MULTIPLE VITAMINS-MINERALS ER PO  Take by mouth.  nitroGLYCERIN 0.4 MG SL tablet  Commonly known as:  NITROSTAT  Place under the tongue.     OMEGA-3 FATTY ACIDS PO  Take by mouth.     omeprazole 40 MG capsule  Commonly known as:  PRILOSEC  Take by mouth.     pravastatin 40 MG tablet  Commonly known as:  PRAVACHOL  Take by mouth.     Saw Palmetto Caps  Take by mouth.     tamsulosin 0.4 MG Caps capsule  Commonly known as:  FLOMAX  Take by mouth.        Allergies:  Allergies  Allergen Reactions  . Baycol  [Cerivastatin]     muscle pain  . Zocor  [Simvastatin]     muscle pain    Family History: Family History  Problem Relation Age of Onset  . Cancer Neg Hx     Bladder, Kideny and Prostate    Social History:  reports that he has quit smoking. He does not have any smokeless tobacco history on file. He reports that he drinks alcohol. His drug history is not on file.  Physical Exam: BP 152/72 mmHg  Pulse 67  Resp 18  Ht 6' (1.829 m)  Wt 193 lb 3.2  oz (87.635 kg)  BMI 26.20 kg/m2  Constitutional:  Alert and oriented, No acute distress.   Elderly, pleasant, ambulating with cane. HEENT: [place] AT, moist mucus membranes.  Trachea midline, no masses. Cardiovascular: No clubbing, cyanosis, or edema. Respiratory: Normal respiratory effort, no increased work of breathing. GI: Abdomen is soft, nontender, nondistended, no abdominal masses GU: No CVA tenderness Skin: No rashes, bruises or suspicious lesions. Neurologic: Grossly intact, no focal deficits, moving all 4 extremities. Psychiatric: Normal mood and affect.   Laboratory Data: Lab Results  Component Value Date   WBC 8.3 03/04/2015   HGB 12.9* 03/04/2015   HCT 38.4* 03/04/2015   MCV 90 03/04/2015   PLT 172 03/04/2015    Lab Results  Component Value Date   CREATININE 2.11* 03/04/2015   PVR: Results for orders placed or performed in visit on 04/18/15  PR COMPLEX UROFLOWMETRY  Result Value Ref Range   Scan Result 159ml    Bladder scan of 151 was obtained after his initial uroflow but the volume was inadequate for interpretation.   He waited in our office for approximately an hour and drink copious amount of water. He was then able to void 384 cc with a peak flow of 15 mL's per second.       IPSS      04/18/15 1400       International Prostate Symptom Score   How often have you had the sensation of not emptying your bladder? Almost always     How often have you had to urinate less than every two hours? Almost always     How often have you found you stopped and started again several times when you urinated? Almost always     How often have you found it difficult to postpone urination? Almost always     How often have you had a weak urinary stream? Almost always     How often have you had to strain to start urination? Less than half the time     How many times did you typically get up at night to urinate? 5 Times     Total IPSS Score 32     Quality of Life due to urinary  symptoms   If you were to  spend the rest of your life with your urinary condition just the way it is now how would you feel about that? Mostly Satisfied         Assessment & Plan:      81 year old male with history of BPH, incomplete bladder emptying currently on finasteride, saw palmetto, and Flomax.   He does report some improvement with addition of finasteride to his regimen. He initially had a slightly elevated postvoid residual today of 151 cc but was able to void again a large amount within an hour which is very reassuring. I am concerned about his overall renal function as the trend seems to be a downward, most recently 2.1. I will continue to follow him closely and continue to check postvoid residuals and a repeat creatinine check next visit.   We discussed that if his creatinine continues to decline, he may need to initiate self-catheterization to ensure adequate bladder emptying.  1. BPH with obstruction/lower urinary tract symptoms - continue finasteride, flomax, and saw palmetto  2. Incomplete bladder emptying  -PVR today  3. Chronic kidney disease (CKD), stage III (moderate) -BMP next visit  Return in about 3 months (around 07/19/2015) for PVR, Cr.  [name], [place] [address] [facility] [place], [place] [address] [phone]

## 2015-07-22 ENCOUNTER — Ambulatory Visit: Payer: Self-pay | Admitting: Urology

## 2015-07-30 ENCOUNTER — Ambulatory Visit: Payer: Medicare Other

## 2015-08-05 ENCOUNTER — Ambulatory Visit: Payer: Medicare Other

## 2015-08-06 ENCOUNTER — Encounter: Payer: Self-pay | Admitting: Urology

## 2015-08-06 ENCOUNTER — Ambulatory Visit (INDEPENDENT_AMBULATORY_CARE_PROVIDER_SITE_OTHER): Payer: Medicare Other | Admitting: Urology

## 2015-08-06 VITALS — BP 161/65 | HR 61 | Ht 72.0 in | Wt 192.5 lb

## 2015-08-06 DIAGNOSIS — N183 Chronic kidney disease, stage 3 unspecified: Secondary | ICD-10-CM

## 2015-08-06 DIAGNOSIS — N138 Other obstructive and reflux uropathy: Secondary | ICD-10-CM

## 2015-08-06 DIAGNOSIS — N401 Enlarged prostate with lower urinary tract symptoms: Secondary | ICD-10-CM

## 2015-08-06 DIAGNOSIS — R739 Hyperglycemia, unspecified: Secondary | ICD-10-CM | POA: Insufficient documentation

## 2015-08-06 LAB — BLADDER SCAN AMB NON-IMAGING: Scan Result: 88

## 2015-08-06 NOTE — Progress Notes (Signed)
08/06/2015 10:26 AM   Cody Blackburn April 22, 1923 [id number]  Referring provider: Dion Body, MD [place], [place] [address]  Chief Complaint  Patient presents with  . Benign Prostatic Hypertrophy       Expand All Collapse All    HPI from last visit with Dr. Erlene Quan: [name] is an 81 year old male with a long-standing history of BPH and voiding issues initially seen and evaluated in 12/15. He returns today for routine follow-up.  He is currently on Flomax, saw palmetto, and started on finasteride at last visit. He did have a slightly elevated PVR last visit at 125 cc as well as an enlarged gland consistent with BPH on rectal exam. Overall today, he reports that he is doing much better since starting the finasteride. He reports that his urinary stream has improved in caliber and he no longer has postvoid Dribbling. He also feels that he is doing a better job of emptying his bladder. He continues to need to double void on a frequent basis.  Previous IPSS 23/3 in 12/15. IPSS today 32/2.   He does have baseline renal dysfunction, creatinine 1.8 on 11/15 which is up from 1.3 in March 2014. Unfortunately, his most recent creatinine was 2.11 on 4/16.  His most recent PSA was checked in March 2014 which is 3.49. DRE enlarged 10/2014 50 cc without nodules.   He denies any urinary tract infections, dysuria, or history of bladder stones. He was formerly a patient of Dr. Bernardo Heater but has not seen a urologist in more than 10 years. He does have a history of undergoing prostate biopsy in the remote past which per patient report was negative.      Interval History: The patient returns to reassess his urinary function. He states that he has nocturia 3. He however drinks a glass of water every time he gets up to urinate. He also drinks water consistently through the day including prior to bedtime. He notes daytime frequency. He says his stream to  same. His PVR today was 83 cc.  PMH: Past Medical History  Diagnosis Date  . GERD (gastroesophageal reflux disease)   . Anxiety and depression   . Skin cancer   . Sleep apnea   . Stomach ulcer   . TIA (transient ischemic attack)   . BPH (benign prostatic hyperplasia)   . Incomplete bladder emptying     Surgical History: Past Surgical History  Procedure Laterality Date  . Cardiac surgery  2005  . Cardiac surgery  1977    Home Medications:    Medication List       This list is accurate as of: 08/06/15 10:26 AM.  Always use your most recent med list.               amLODipine 5 MG tablet  Commonly known as:  NORVASC  Take by mouth.     carvedilol 3.125 MG tablet  Commonly known as:  COREG  Take by mouth.     Co Q 10 100 MG Caps  Take by mouth.     Docusate Sodium 100 MG capsule  Take by mouth.     finasteride 5 MG tablet  Commonly known as:  PROSCAR  Take by mouth.     fluticasone 50 MCG/ACT nasal spray  Commonly known as:  FLONASE  Place into the nose.     hydrochlorothiazide 12.5 MG tablet  Commonly known as:  HYDRODIURIL  Take by mouth.     levothyroxine  88 MCG tablet  Commonly known as:  SYNTHROID, LEVOTHROID  Take by mouth.     loratadine 10 MG tablet  Commonly known as:  CLARITIN  Take by mouth.     losartan 50 MG tablet  Commonly known as:  COZAAR  Take by mouth.     MULTIPLE VITAMINS-MINERALS ER PO  Take by mouth.     nitroGLYCERIN 0.4 MG SL tablet  Commonly known as:  NITROSTAT  Place under the tongue.     OMEGA-3 FATTY ACIDS PO  Take by mouth.     omeprazole 40 MG capsule  Commonly known as:  PRILOSEC  Take by mouth.     pravastatin 40 MG tablet  Commonly known as:  PRAVACHOL  Take by mouth.     Saw Palmetto Caps  Take by mouth.     tamsulosin 0.4 MG Caps capsule  Commonly known as:  FLOMAX  Take by mouth.     triamcinolone cream 0.1 %  Commonly known as:  KENALOG        Allergies:  Allergies  Allergen  Reactions  . Baycol  [Cerivastatin]     Other reaction(s): Muscle Pain muscle pain  . Zocor  [Simvastatin]     Other reaction(s): Muscle Pain muscle pain    Family History: Family History  Problem Relation Age of Onset  . Cancer Neg Hx     Bladder, Kideny and Prostate  . Prostate cancer Neg Hx     Social History:  reports that he has quit smoking. He does not have any smokeless tobacco history on file. He reports that he drinks alcohol. His drug history is not on file.  ROS: UROLOGY Frequent Urination?: Yes Hard to postpone urination?: Yes Burning/pain with urination?: No Get up at night to urinate?: Yes Leakage of urine?: Yes Urine stream starts and stops?: No Trouble starting stream?: No Do you have to strain to urinate?: No Blood in urine?: No Urinary tract infection?: No Sexually transmitted disease?: No Injury to kidneys or bladder?: No Painful intercourse?: No Weak stream?: No Erection problems?: Yes Penile pain?: No  Gastrointestinal Nausea?: No Vomiting?: No Indigestion/heartburn?: No Diarrhea?: No Constipation?: No  Constitutional Fever: No Night sweats?: No Weight loss?: No Fatigue?: No  Skin Skin rash/lesions?: No Itching?: No  Eyes Blurred vision?: No Double vision?: No  Ears/Nose/Throat Sore throat?: No Sinus problems?: No  Hematologic/Lymphatic Swollen glands?: No Easy bruising?: Yes  Cardiovascular Leg swelling?: No Chest pain?: No  Respiratory Cough?: No Shortness of breath?: No  Endocrine Excessive thirst?: No  Musculoskeletal Back pain?: No Joint pain?: No  Neurological Headaches?: No Dizziness?: No  Psychologic Depression?: No Anxiety?: No  Physical Exam: BP 161/65 mmHg  Pulse 61  Ht 6' (1.829 m)  Wt 192 lb 8 oz (87.317 kg)  BMI 26.10 kg/m2  Constitutional:  Alert and oriented, No acute distress. HEENT: [place] AT, moist mucus membranes.  Trachea midline, no masses. Cardiovascular: No clubbing, cyanosis,  or edema. Respiratory: Normal respiratory effort, no increased work of breathing. GI: Abdomen is soft, nontender, nondistended, no abdominal masses GU: No CVA tenderness.  Skin: No rashes, bruises or suspicious lesions. Lymph: No cervical or inguinal adenopathy. Neurologic: Grossly intact, no focal deficits, moving all 4 extremities. Psychiatric: Normal mood and affect.  Laboratory Data: Lab Results  Component Value Date   WBC 8.3 03/04/2015   HGB 12.9* 03/04/2015   HCT 38.4* 03/04/2015   MCV 90 03/04/2015   PLT 172 03/04/2015    Lab Results  Component Value Date   CREATININE 2.11* 03/04/2015    No results found for: PSA  No results found for: TESTOSTERONE  No results found for: HGBA1C  Urinalysis    Component Value Date/Time   COLORURINE Yellow 03/04/2015 0312   APPEARANCEUR Clear 03/04/2015 0312   LABSPEC 1.009 03/04/2015 0312   PHURINE 5.0 03/04/2015 0312   GLUCOSEU Negative 03/04/2015 0312   HGBUR Negative 03/04/2015 0312   BILIRUBINUR Negative 03/04/2015 0312   KETONESUR Negative 03/04/2015 0312   PROTEINUR 30 mg/dL 03/04/2015 0312   NITRITE Negative 03/04/2015 0312   LEUKOCYTESUR Negative 03/04/2015 0312    Pertinent Imaging:  PVR: 83 cc.  Assessment & Plan:    1. BPH with obstruction/lower urinary tract symptoms He is adequately emptying his bladder, though he still has some symptom bother. Believe his nocturia and frequency is related to his large amount of fluid intake. I advised him not to drink fluid when he gets up to urinate in the middle the night. I've also advised not to drink anything after 7 PM. This should decrease his nocturia symptoms. - follow up in 3 months with PVR, uroflow  2. Chronic kidney disease (CKD), stage III (moderate) We will recheck his creatinine today. As 2.1 and his last visit. He is adequately emptying his bladder with a PVR of 83 cc. I do not believe the origin of this EKG is postobstructive in nature. If his creatinine  remains elevated, we will recommend to his PCP that he is referred to nephrology. - Basic metabolic panel   Return in about 3 months (around 11/05/2015) for PVR, uroflow.  Nickie Retort, MD  [facility] [address] [place], [place] [address] [phone]

## 2015-08-07 LAB — BASIC METABOLIC PANEL
BUN / CREAT RATIO: 18 (ref 10–22)
BUN: 31 mg/dL — ABNORMAL HIGH (ref 8–27)
CHLORIDE: 100 mmol/L (ref 97–108)
CO2: 26 mmol/L (ref 18–29)
Calcium: 9.7 mg/dL (ref 8.6–10.2)
Creatinine, Ser: 1.72 mg/dL — ABNORMAL HIGH (ref 0.76–1.27)
GFR calc Af Amer: 40 mL/min/{1.73_m2} — ABNORMAL LOW (ref >59)
GFR calc non Af Amer: 34 mL/min/{1.73_m2} — ABNORMAL LOW (ref >59)
Glucose: 141 mg/dL — ABNORMAL HIGH (ref 65–99)
Potassium: 4.6 mmol/L (ref 3.5–5.2)
Sodium: 139 mmol/L (ref 134–144)

## 2015-10-27 ENCOUNTER — Other Ambulatory Visit: Payer: Self-pay | Admitting: Nephrology

## 2015-10-27 DIAGNOSIS — N183 Chronic kidney disease, stage 3 unspecified: Secondary | ICD-10-CM

## 2015-10-29 ENCOUNTER — Ambulatory Visit
Admission: RE | Admit: 2015-10-29 | Discharge: 2015-10-29 | Disposition: A | Discharge status: Home / Self Care | Payer: Medicare Other | Source: Ambulatory Visit | Attending: Nephrology | Admitting: Nephrology

## 2015-10-30 ENCOUNTER — Ambulatory Visit
Admission: RE | Admit: 2015-10-30 | Discharge: 2015-10-30 | Disposition: A | Discharge status: Home / Self Care | Payer: Medicare Other | Source: Ambulatory Visit | Attending: Nephrology | Admitting: Nephrology

## 2015-10-30 ENCOUNTER — Telehealth: Payer: Self-pay | Admitting: Urology

## 2015-10-30 DIAGNOSIS — N183 Chronic kidney disease, stage 3 unspecified: Secondary | ICD-10-CM

## 2015-10-30 NOTE — Telephone Encounter (Signed)
Just wanted to let you know that Mr. Shiver mixed up his RUS appts he thought it was today and it was actually yesterday. I told him to call and reschd it and try to see if he could get it for tomorrow the 16th since he will be leaving to go out of town for the next three weeks. I told him if he did that maybe you would be willing to call him with the results. He said he would reschd and call me back to let me know what he was able to do.  Thanks, Sharyn Lull

## 2015-10-31 ENCOUNTER — Ambulatory Visit (INDEPENDENT_AMBULATORY_CARE_PROVIDER_SITE_OTHER): Payer: Medicare Other | Admitting: Urology

## 2015-10-31 ENCOUNTER — Ambulatory Visit: Payer: Medicare Other | Admitting: Urology

## 2015-10-31 ENCOUNTER — Encounter: Payer: Self-pay | Admitting: Urology

## 2015-10-31 VITALS — BP 180/68 | HR 63 | Ht 72.0 in | Wt 192.0 lb

## 2015-10-31 DIAGNOSIS — R351 Nocturia: Secondary | ICD-10-CM

## 2015-10-31 DIAGNOSIS — N401 Enlarged prostate with lower urinary tract symptoms: Secondary | ICD-10-CM

## 2015-10-31 DIAGNOSIS — F419 Anxiety disorder, unspecified: Secondary | ICD-10-CM | POA: Insufficient documentation

## 2015-10-31 DIAGNOSIS — F3340 Major depressive disorder, recurrent, in remission, unspecified: Secondary | ICD-10-CM | POA: Insufficient documentation

## 2015-10-31 DIAGNOSIS — N183 Chronic kidney disease, stage 3 unspecified: Secondary | ICD-10-CM

## 2015-10-31 DIAGNOSIS — R339 Retention of urine, unspecified: Secondary | ICD-10-CM

## 2015-10-31 DIAGNOSIS — N138 Other obstructive and reflux uropathy: Secondary | ICD-10-CM

## 2015-10-31 DIAGNOSIS — R7303 Prediabetes: Secondary | ICD-10-CM | POA: Insufficient documentation

## 2015-10-31 LAB — BLADDER SCAN AMB NON-IMAGING

## 2015-10-31 MED ORDER — FINASTERIDE 5 MG PO TABS: 5.0000 mg | ORAL_TABLET | Freq: Every day | ORAL | Status: DC

## 2015-10-31 NOTE — Progress Notes (Signed)
5:41 PM  10/31/2015  Cody Blackburn 1923/10/23 [id number]  Referring provider: Dion Body, MD [facility] [place], [place] [address]  Chief Complaint  Patient presents with  . Benign Prostatic Hypertrophy    26monthwith URoflow    HPI: 81 year old male with a long-standing history of BPH and voiding issues who returns today for PVR/ Uroflow.  He is currently on Flomax,  saw palmetto, and previously on finasteride, however, he reports today that his cardiologist told him to stop taking this medication 3 months ago for unclear reasons.   He does have baseline renal dysfunction with slowly up trending creatinine, most recently 1.9 on 10/13/2015. He did have a renal ultrasound yesterday ordered by his nephrologist which shows echogenic kidneys, no hydronephrosis or masses.  His most recent PSA was checked in March 2014 which is 3.49.  DRE enlarged 10/2014 50 cc without nodules.    He does have a history of undergoing prostate biopsy in the remote past which per patient report was negative.  Today, he continues to have urinary symptoms including frequency, double voiding, and nocturia 3. He is mostly bothered by his nocturia. He does have a history of OSA and wears a CPAP regularly. He also has bilateral lower extremity edema.  He has been trying to avoid water in the late evening's.    PMH: Past Medical History  Diagnosis Date  . GERD (gastroesophageal reflux disease)   . Anxiety and depression   . Skin cancer   . Sleep apnea   . Stomach ulcer   . TIA (transient ischemic attack)   . BPH (benign prostatic hyperplasia)   . Incomplete bladder emptying     Surgical History: Past Surgical History  Procedure Laterality Date  . Cardiac surgery  2005  . Cardiac surgery  1977    Home Medications:    Medication List       This list is accurate as of: 10/31/15  5:40 PM.  Always use your most recent med list.               amLODipine 5 MG tablet  Commonly known as:  NORVASC  Take by mouth.     carvedilol 3.125 MG tablet  Commonly known as:  COREG  Take by mouth.     Co Q 10 100 MG Caps  Take by mouth.     Docusate Sodium 100 MG capsule  Take by mouth.     finasteride 5 MG tablet  Commonly known as:  PROSCAR  Take 1 tablet (5 mg total) by mouth daily.     fluticasone 50 MCG/ACT nasal spray  Commonly known as:  FLONASE  Place into the nose.     hydrochlorothiazide 12.5 MG tablet  Commonly known as:  HYDRODIURIL  Take by mouth.     levothyroxine 88 MCG tablet  Commonly known as:  SYNTHROID, LEVOTHROID  Take by mouth.     loratadine 10 MG tablet  Commonly known as:  CLARITIN  Take by mouth.     losartan 50 MG tablet  Commonly known as:  COZAAR  Take by mouth.     MULTIPLE VITAMINS-MINERALS ER PO  Take by mouth.     nitroGLYCERIN 0.4 MG SL tablet  Commonly known as:  NITROSTAT  Place under the tongue.     OMEGA-3 FATTY ACIDS PO  Take by mouth.     omeprazole 40 MG capsule  Commonly known as:  PRILOSEC  Take by  mouth.     pravastatin 40 MG tablet  Commonly known as:  PRAVACHOL  Take by mouth.     Saw Palmetto Caps  Take by mouth.     tamsulosin 0.4 MG Caps capsule  Commonly known as:  FLOMAX  Take by mouth.     triamcinolone cream 0.1 %  Commonly known as:  KENALOG        Allergies:  Allergies  Allergen Reactions  . Baycol  [Cerivastatin]     Other reaction(s): Muscle Pain muscle pain  . Zocor  [Simvastatin]     Other reaction(s): Muscle Pain muscle pain    Family History: Family History  Problem Relation Age of Onset  . Cancer Neg Hx     Bladder, Kideny and Prostate  . Prostate cancer Neg Hx     Social History:  reports that he has quit smoking. He does not have any smokeless tobacco history on file. He reports that he drinks alcohol. His drug history is not on file.  Physical Exam: BP 180/68 mmHg  Pulse 63  Ht 6' (1.829 m)  Wt 192 lb (87.091 kg)   BMI 26.03 kg/m2  Constitutional:  Alert and oriented, No acute distress.   Elderly, pleasant, ambulating with cane. HEENT: [place] AT, moist mucus membranes.  Trachea midline, no masses. Cardiovascular: No clubbing, cyanosis, or edema. Respiratory: Normal respiratory effort, no increased work of breathing. GI: Abdomen is soft, nontender, nondistended, no abdominal masses GU: No CVA tenderness Skin: No rashes, bruises or suspicious lesions. Neurologic: Grossly intact, no focal deficits, moving all 4 extremities. Psychiatric: Normal mood and affect.   Laboratory Data: Comprehensive Metabolic Panel (CMP) (16/08/9603 9:41 AM) Comprehensive Metabolic Panel (CMP) (54/07/8118 9:41 AM)  Component Value Ref Range  Glucose 123 (H) 70 - 110 mg/dL  Sodium 144 136 - 145 mmol/L  Potassium 4.4 3.6 - 5.1 mmol/L  Chloride 106 97 - 109 mmol/L  Carbon Dioxide (CO2) 25.2 22.0 - 32.0 mmol/L  Urea Nitrogen (BUN) 33 (H) 7 - 25 mg/dL  Creatinine 1.9 (H) 0.7 - 1.3 mg/dL  Glomerular Filtration Rate (eGFR), MDRD Estimate 33 (L) >60 mL/min/1.73sq m     PVR: Results for orders placed or performed in visit on 10/31/15  BLADDER SCAN AMB NON-IMAGING  Result Value Ref Range   Scan Result 71m    See Epic for uroflow results. Peak flow 21 mL's per second, improved from previous of 15 mL's per second. Graph curve shows a sudden uptick,, relatively flat drawn out peak, obstructed pattern.  Voided volume 395 cc.  RUS  CLINICAL DATA: Stage 3 kidney disease  EXAM: RENAL / URINARY TRACT ULTRASOUND COMPLETE  COMPARISON: None.  FINDINGS: Right Kidney:  Length: 9.8 cm. Slightly increased echotexture throughout the kidney. Mild cortical thinning. No hydronephrosis or mass  Left Kidney:  Length: 11.2 cm. Increased echotexture and mild cortical thinning. No hydronephrosis or mass. Small lateral cyst measures 1.7 cm.  Bladder:  Appears normal for degree of bladder  distention.  IMPRESSION: Increased echotexture within the kidneys with cortical thinning. Findings compatible with chronic medical renal disease. No hydronephrosis.   Electronically Signed  By: KRolm BaptiseM.D.  On: 10/31/2015 08:11    Assessment & Plan:       1. BPH with obstruction/lower urinary tract symptoms - continue Flomax -recommended restarting finasteride as I see no cardiac contraindications for this medication -although his uroflow does show a somewhat obstructive voiding pattern, he does achieve an excellent peak flow of 21 mL's per second  and improved post void residual therefore I do not feel that he would benefit much from surgical intervention at this time -we discussed behavioral modification again today with the double voiding as needed to empty his bladder -prefer to avoid anticholinergic for overactivity symptoms in this patient given his age  36. Incomplete bladder emptying  -PVR today improved compared to previous, 91 cc   3. Chronic kidney disease (CKD), stage III (moderate) -RUS reviewed, no evidence of hydronephrosis  4. Nocturia Discussed pathophysiology of nocturia, suspect his is multifactorial including OSA, lower x-ray edema, physiologic redistribution of fluid, medication related, along with his BPH. I do not feel that he benefit much from medical therapy and this would only contribute polypharmacy.  Return in about 1 year (around 10/30/2016) for PVR, IPSS. or sooner as needed  [name], MD  [facility] [address] [place], [place] [address] [phone]

## 2015-10-31 NOTE — Progress Notes (Signed)
Uroflow  Peak Flow: 21.50ml Average Flow: 9.27ml Voided Volume: 381ml Voiding Time: 48.5sec Flow Time: 40.5sec Time to Peak Flow: 1.2sec  PVR Volume: 80ml

## 2015-11-05 ENCOUNTER — Ambulatory Visit: Payer: Medicare Other

## 2016-01-23 DIAGNOSIS — M79606 Pain in leg, unspecified: Secondary | ICD-10-CM | POA: Insufficient documentation

## 2016-01-23 DIAGNOSIS — M25561 Pain in right knee: Secondary | ICD-10-CM | POA: Insufficient documentation

## 2016-01-23 DIAGNOSIS — S76319A Strain of muscle, fascia and tendon of the posterior muscle group at thigh level, unspecified thigh, initial encounter: Secondary | ICD-10-CM | POA: Insufficient documentation

## 2016-01-23 DIAGNOSIS — N289 Disorder of kidney and ureter, unspecified: Secondary | ICD-10-CM | POA: Insufficient documentation

## 2016-05-03 ENCOUNTER — Other Ambulatory Visit: Payer: Self-pay | Admitting: Nephrology

## 2016-10-28 DIAGNOSIS — D638 Anemia in other chronic diseases classified elsewhere: Secondary | ICD-10-CM | POA: Insufficient documentation

## 2016-10-29 ENCOUNTER — Ambulatory Visit: Payer: Medicare Other | Admitting: Urology

## 2016-10-29 ENCOUNTER — Ambulatory Visit (INDEPENDENT_AMBULATORY_CARE_PROVIDER_SITE_OTHER): Payer: Medicare Other | Admitting: Urology

## 2016-10-29 ENCOUNTER — Encounter: Payer: Self-pay | Admitting: Urology

## 2016-10-29 VITALS — BP 150/67 | HR 65 | Ht 72.0 in | Wt 189.0 lb

## 2016-10-29 DIAGNOSIS — R351 Nocturia: Secondary | ICD-10-CM | POA: Diagnosis not present

## 2016-10-29 DIAGNOSIS — R339 Retention of urine, unspecified: Secondary | ICD-10-CM | POA: Diagnosis not present

## 2016-10-29 DIAGNOSIS — N138 Other obstructive and reflux uropathy: Secondary | ICD-10-CM

## 2016-10-29 DIAGNOSIS — N183 Chronic kidney disease, stage 3 unspecified: Secondary | ICD-10-CM

## 2016-10-29 DIAGNOSIS — N401 Enlarged prostate with lower urinary tract symptoms: Secondary | ICD-10-CM

## 2016-10-29 LAB — BLADDER SCAN AMB NON-IMAGING

## 2016-10-29 MED ORDER — FINASTERIDE 5 MG PO TABS: 5.0000 mg | ORAL_TABLET | Freq: Every day | ORAL | 3 refills | Status: DC

## 2016-10-29 MED ORDER — TAMSULOSIN HCL 0.4 MG PO CAPS
0.8000 mg | ORAL_CAPSULE | Freq: Every day | ORAL | 3 refills | Status: DC
Start: 2016-10-29 — End: 2017-10-17

## 2016-10-29 NOTE — Progress Notes (Signed)
10:36 AM  10/29/16  Cody Blackburn 10-10-1923 [id number]  Referring provider: Dion Body, MD [facility] [place], [place] [address]  Chief Complaint  Patient presents with  . Benign Prostatic Hypertrophy    1year    HPI: 82 year old male with a long-standing history of BPH and voiding issues.    He is currently on Flomax,  saw palmetto, and  finasteride.  He does have baseline renal dysfunction, stage III CKD which was been stable since last year, most recent Cr 1.8 on 10/21/16.    His most recent PSA was checked in March 2014 which is 3.49.  DRE enlarged 10/2014 50 cc without nodules.    He does have a history of undergoing prostate biopsy in the remote past which per patient report was negative.  Today, he continues to have urinary symptoms including frequency, double voiding, and nocturia 2. He is mostly bothered by his nocturia but it has improved somewhat. He does have a history of OSA and wears a CPAP regularly. He also has bilateral lower extremity edema.  He drinks a full glass of water water just before bed.    PVR 50 cc.      IPSS    Row Name 10/29/16 1000         International Prostate Symptom Score   How often have you had the sensation of not emptying your bladder? Less than 1 in 5     How often have you had to urinate less than every two hours? About half the time     How often have you found you stopped and started again several times when you urinated? Not at All     How often have you found it difficult to postpone urination? About half the time     How often have you had a weak urinary stream? Less than half the time     How often have you had to strain to start urination? Not at All     How many times did you typically get up at night to urinate? 2 Times     Total IPSS Score 11       Quality of Life due to urinary symptoms   If you were to spend the rest of your life with your urinary condition just the way it  is now how would you feel about that? Pleased        Score:  1-7 Mild 8-19 Moderate 20-35 Severe   PMH: Past Medical History:  Diagnosis Date  . Anxiety and depression   . BPH (benign prostatic hyperplasia)   . GERD (gastroesophageal reflux disease)   . Incomplete bladder emptying   . Skin cancer   . Sleep apnea   . Stomach ulcer   . TIA (transient ischemic attack)     Surgical History: Past Surgical History:  Procedure Laterality Date  . CARDIAC SURGERY  2005  . CARDIAC SURGERY  1977    Home Medications:  Allergies as of 10/29/2016      Reactions   Baycol  [cerivastatin]    Other reaction(s): Muscle Pain muscle pain   Zocor  [simvastatin]    Other reaction(s): Muscle Pain muscle pain      Medication List       Accurate as of 10/29/16 10:36 AM. Always use your most recent med list.          amLODipine 5 MG tablet Commonly known as:  NORVASC Take by mouth.  carvedilol 3.125 MG tablet Commonly known as:  COREG Take by mouth.   Co Q 10 100 MG Caps Take by mouth.   Docusate Sodium 100 MG capsule Take by mouth.   finasteride 5 MG tablet Commonly known as:  PROSCAR Take 1 tablet (5 mg total) by mouth daily.   fluticasone 50 MCG/ACT nasal spray Commonly known as:  FLONASE Place into the nose.   hydrochlorothiazide 12.5 MG tablet Commonly known as:  HYDRODIURIL Take by mouth.   levothyroxine 88 MCG tablet Commonly known as:  SYNTHROID, LEVOTHROID Take by mouth.   loratadine 10 MG tablet Commonly known as:  CLARITIN Take by mouth.   losartan 50 MG tablet Commonly known as:  COZAAR Take by mouth.   MULTIPLE VITAMINS-MINERALS ER PO Take by mouth.   nitroGLYCERIN 0.4 MG SL tablet Commonly known as:  NITROSTAT Place under the tongue.   OMEGA-3 FATTY ACIDS PO Take by mouth.   omeprazole 40 MG capsule Commonly known as:  PRILOSEC Take by mouth.   pravastatin 40 MG tablet Commonly known as:  PRAVACHOL Take by mouth.   Saw  Palmetto Caps Take by mouth.   tamsulosin 0.4 MG Caps capsule Commonly known as:  FLOMAX Take by mouth.   triamcinolone cream 0.1 % Commonly known as:  KENALOG       Allergies:  Allergies  Allergen Reactions  . Baycol  [Cerivastatin]     Other reaction(s): Muscle Pain muscle pain  . Zocor  [Simvastatin]     Other reaction(s): Muscle Pain muscle pain    Family History: Family History  Problem Relation Age of Onset  . Cancer Neg Hx     Bladder, Kideny and Prostate  . Prostate cancer Neg Hx     Social History:  reports that he has quit smoking. He does not have any smokeless tobacco history on file. He reports that he drinks alcohol. His drug history is not on file.  Physical Exam: BP (!) 150/67   Pulse 65   Ht 6' (1.829 m)   Wt 189 lb (85.7 kg)   BMI 25.63 kg/m   Constitutional:  Alert and oriented, No acute distress.   Elderly, pleasant, ambulating with cane. HEENT: [place] AT, moist mucus membranes.  Trachea midline, no masses. Cardiovascular: No clubbing, cyanosis, or edema. Respiratory: Normal respiratory effort, no increased work of breathing. GI: Abdomen is soft, nontender, nondistended, no abdominal masses GU: No CVA tenderness DRE: Not indicated given age Skin: No rashes, bruises or suspicious lesions. Neurologic: Grossly intact, no focal deficits, moving all 4 extremities. Psychiatric: Normal mood and affect.   Laboratory Data: Cr as above  PVR: Results for orders placed or performed in visit on 10/29/16  BLADDER SCAN AMB NON-IMAGING  Result Value Ref Range   Scan Result 70ml     Assessment & Plan:      1. BPH with obstruction/lower urinary tract symptoms - continue Flomax and finasteride -we discussed behavioral modification again today with the double voiding as needed to empty his bladder  2. Incomplete bladder emptying (history )  -PVR excellent today  3. Chronic kidney disease (CKD), stage III (moderate) -stable, followed by  nephrology  4. Nocturia -Multifactorial -Behavioral modification   Return in about 1 year (around 10/29/2017) for IPSS, PVR.   Cody Espy, MD  [facility] [address] [place], [place] [address] [phone]

## 2017-03-07 DIAGNOSIS — M160 Bilateral primary osteoarthritis of hip: Secondary | ICD-10-CM | POA: Insufficient documentation

## 2017-03-07 DIAGNOSIS — M5136 Other intervertebral disc degeneration, lumbar region: Secondary | ICD-10-CM | POA: Insufficient documentation

## 2017-03-22 ENCOUNTER — Encounter (INDEPENDENT_AMBULATORY_CARE_PROVIDER_SITE_OTHER): Payer: Self-pay

## 2017-03-22 ENCOUNTER — Ambulatory Visit (INDEPENDENT_AMBULATORY_CARE_PROVIDER_SITE_OTHER): Payer: Self-pay | Admitting: Vascular Surgery

## 2017-03-22 ENCOUNTER — Other Ambulatory Visit (INDEPENDENT_AMBULATORY_CARE_PROVIDER_SITE_OTHER): Payer: Self-pay

## 2017-03-28 ENCOUNTER — Other Ambulatory Visit: Payer: Self-pay | Admitting: Orthopedic Surgery

## 2017-03-28 DIAGNOSIS — M48062 Spinal stenosis, lumbar region with neurogenic claudication: Secondary | ICD-10-CM

## 2017-03-28 DIAGNOSIS — M5136 Other intervertebral disc degeneration, lumbar region: Secondary | ICD-10-CM

## 2017-04-07 ENCOUNTER — Ambulatory Visit
Admission: RE | Admit: 2017-04-07 | Discharge: 2017-04-07 | Disposition: A | Discharge status: Home / Self Care | Payer: Medicare Other | Source: Ambulatory Visit | Attending: Orthopedic Surgery | Admitting: Orthopedic Surgery

## 2017-04-07 DIAGNOSIS — M5136 Other intervertebral disc degeneration, lumbar region: Secondary | ICD-10-CM | POA: Insufficient documentation

## 2017-04-07 DIAGNOSIS — M48062 Spinal stenosis, lumbar region with neurogenic claudication: Secondary | ICD-10-CM | POA: Insufficient documentation

## 2017-04-20 ENCOUNTER — Encounter: Payer: Self-pay | Admitting: Family Medicine

## 2017-04-20 ENCOUNTER — Ambulatory Visit (INDEPENDENT_AMBULATORY_CARE_PROVIDER_SITE_OTHER): Payer: Medicare Other | Admitting: Family Medicine

## 2017-04-20 VITALS — BP 120/62 | HR 56 | Temp 98.3°F | Ht 72.0 in | Wt 192.2 lb

## 2017-04-20 DIAGNOSIS — R5383 Other fatigue: Secondary | ICD-10-CM

## 2017-04-20 DIAGNOSIS — E663 Overweight: Secondary | ICD-10-CM | POA: Diagnosis not present

## 2017-04-20 DIAGNOSIS — D649 Anemia, unspecified: Secondary | ICD-10-CM | POA: Diagnosis not present

## 2017-04-20 DIAGNOSIS — I10 Essential (primary) hypertension: Secondary | ICD-10-CM | POA: Diagnosis not present

## 2017-04-20 DIAGNOSIS — N183 Chronic kidney disease, stage 3 unspecified: Secondary | ICD-10-CM

## 2017-04-20 DIAGNOSIS — E039 Hypothyroidism, unspecified: Secondary | ICD-10-CM | POA: Diagnosis not present

## 2017-04-20 LAB — TSH: TSH: 1.07 u[IU]/mL (ref 0.35–4.50)

## 2017-04-20 LAB — COMPREHENSIVE METABOLIC PANEL
ALBUMIN: 4.4 g/dL (ref 3.5–5.2)
ALT: 24 U/L (ref 0–53)
AST: 29 U/L (ref 0–37)
Alkaline Phosphatase: 60 U/L (ref 39–117)
BUN: 44 mg/dL — ABNORMAL HIGH (ref 6–23)
CHLORIDE: 106 meq/L (ref 96–112)
CO2: 28 meq/L (ref 19–32)
CREATININE: 2.13 mg/dL — AB (ref 0.40–1.50)
Calcium: 10 mg/dL (ref 8.4–10.5)
GFR: 31.06 mL/min — ABNORMAL LOW (ref >60.00)
Glucose, Bld: 108 mg/dL — ABNORMAL HIGH (ref 70–99)
Potassium: 4.3 mEq/L (ref 3.5–5.1)
SODIUM: 141 meq/L (ref 135–145)
Total Bilirubin: 0.4 mg/dL (ref 0.2–1.2)
Total Protein: 7 g/dL (ref 6.0–8.3)

## 2017-04-20 LAB — CBC
HCT: 37.2 % — ABNORMAL LOW (ref 39.0–52.0)
Hemoglobin: 12.7 g/dL — ABNORMAL LOW (ref 13.0–17.0)
MCHC: 34.2 g/dL (ref 30.0–36.0)
MCV: 90.3 fl (ref 78.0–100.0)
Platelets: 189 10*3/uL (ref 150.0–400.0)
RBC: 4.12 Mil/uL — AB (ref 4.22–5.81)
RDW: 14.5 % (ref 11.5–15.5)
WBC: 5.7 10*3/uL (ref 4.0–10.5)

## 2017-04-20 LAB — SEDIMENTATION RATE: Sed Rate: 2 mm/hr (ref 0–20)

## 2017-04-20 NOTE — Assessment & Plan Note (Signed)
Patient reports he has gained a few pounds recently. He wants to know what he can do to change this. Based on discussing his diet it doesn't sound as though he has a great grasp of what a healthy diet might be. He is limited by his exhaustion with how much activity he can do at this time. I discussed dietary changes and gave him dietary instructions.

## 2017-04-20 NOTE — Assessment & Plan Note (Signed)
Recheck TSH 

## 2017-04-20 NOTE — Patient Instructions (Addendum)
Nice to meet you. We'll check some lab work. Please proceed with the echo as scheduled. If your symptoms worsen please let us know.  Diet Recommendations  Starchy (carb) foods: Bread, rice, pasta, potatoes, corn, cereal, grits, crackers, bagels, muffins, all baked goods.  (Fruits, milk, and yogurt also have carbohydrate, but most of these foods will not spike your blood sugar as the starchy foods will.)  A few fruits do cause high blood sugars; use small portions of bananas (limit to 1/2 at a time), grapes, watermelon, oranges, and most tropical fruits.    Protein foods: Meat, fish, poultry, eggs, dairy foods, and beans such as pinto and kidney beans (beans also provide carbohydrate).   1. Eat at least 3 meals and 1-2 snacks per day. Never go more than 4-5 hours while awake without eating. Eat breakfast within the first hour of getting up.   2. Limit starchy foods to TWO per meal and ONE per snack. ONE portion of a starchy  food is equal to the following:   - ONE slice of bread (or its equivalent, such as half of a hamburger bun).   - 1/2 cup of a "scoopable" starchy food such as potatoes or rice.   - 15 grams of carbohydrate as shown on food label.  3. Include at every meal: a protein food, a carb food, and vegetables and/or fruit.   - Obtain twice the volume of veg's as protein or carbohydrate foods for both lunch and dinner.   - Fresh or frozen veg's are best.   - Keep frozen veg's on hand for a quick vegetable serving.

## 2017-04-20 NOTE — Assessment & Plan Note (Signed)
Patient has been evaluated by his cardiologist for this and plans on having an echo next week. I agree that this is probably the first step and if this is unremarkable we may need to reevaluate. We will obtain some lab work today to evaluate his anemia, kidney function, and thyroid. Symptoms may be related to his carvedilol as this can cause fatigue though I would like to see what his echo reveals prior to changing any medications. We'll monitor. He is given return precautions.

## 2017-04-20 NOTE — Assessment & Plan Note (Signed)
Blood pressure seems to be fairly well controlled. We'll currently continue his medications. Wonder if maybe his carvedilol could be contributing to his exhaustion and we'll see what his echo reveals first.

## 2017-04-20 NOTE — Assessment & Plan Note (Signed)
Recheck kidney function.  

## 2017-04-20 NOTE — Progress Notes (Addendum)
Tommi Rumps, MD Phone: [phone]  Cody Blackburn is a 83 y.o. male who presents today for new patient visit.  Patient reports for at least the last 6 months up to the last year he has had issues with exhaustion. He notes it occurs after eating and can also occur with exertion to the point where he needs to take a nap afterwards. Notes it is also worse if his blood pressure drops below the 120s. At times he has felt as though he was going to pass out though has not passed out. Exercise did help initially though has continued to have issues with it. He notes no chest pain or shortness of breath with it. He does wear a CPAP nightly. He is followed with cardiology and recently saw them. Their note was reviewed. They did ABIs and plan on obtaining an echo. He has been seen by orthopedics as it was felt that possibly spinal stenosis could be contributing though he reports his MRI did not reveal this. The patient does note he has hypothyroidism and is on Synthroid. His most recent TSH was in the normal range. He does take all his blood pressure medicines and they typically run in the 130s and 140s. Rarely gets lightheaded.  Active Ambulatory Problems    Diagnosis Date Noted  . Anxiety and depression 02/15/2015  . BPH with obstruction/lower urinary tract symptoms 02/15/2015  . Acid reflux 02/15/2015  . Incomplete bladder emptying 02/15/2015  . CA of skin 02/15/2015  . Apnea, sleep 02/15/2015  . Gastric ulcer 02/15/2015  . Temporary cerebral vascular dysfunction 02/15/2015  . Benign essential tremor 04/18/2015  . Benign hypertension 02/20/2014  . Borderline diabetes 04/18/2015  . Chronic kidney disease (CKD), stage III (moderate) 09/25/2014  . 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 CAD in native artery 02/20/2014  . Clinical depression 04/18/2015  . Dizziness 03/18/2015  . Cerebrovascular accident, old 04/18/2015  . Adult hypothyroidism 04/18/2015  .  Obstructive apnea 04/18/2015  . Panic attack 04/18/2015  . Gastroduodenal ulcer 04/18/2015  . Hypercholesterolemia without hypertriglyceridemia 02/20/2014  . Abnormal vision as late effect of cerebrovascular disease 02/26/2015  . Blood glucose elevated 08/06/2015  . Anxiety 10/31/2015  . Borderline diabetes mellitus 10/31/2015  . Pure hypercholesterolemia 02/20/2014  . Recurrent major depression in remission ([place]) 10/31/2015  . Exhaustion 04/20/2017  . Overweight 04/20/2017   Resolved Ambulatory Problems    Diagnosis Date Noted  . No Resolved Ambulatory Problems   Past Medical History:  Diagnosis Date  . Anxiety and depression   . BPH (benign prostatic hyperplasia)   . GERD (gastroesophageal reflux disease)   . Incomplete bladder emptying   . Skin cancer   . Sleep apnea   . Stomach ulcer   . TIA (transient ischemic attack)     Family History  Problem Relation Age of Onset  . Cancer Neg Hx        Bladder, Kideny and Prostate  . Prostate cancer Neg Hx     Social History   Social History  . Marital status: Married    Spouse name: N/A  . Number of children: N/A  . Years of education: N/A   Occupational History  . Not on file.   Social History Main Topics  . Smoking status: Former Research scientist (life sciences)  . Smokeless tobacco: Never Used  . Alcohol use Yes     Comment: occasional  . Drug use: Unknown  . Sexual activity: Not on file   Other Topics  Concern  . Not on file   Social History Narrative  . No narrative on file    ROS  General:  Negative for nexplained weight loss, fever Skin: Negative for new or changing mole, sore that won't heal HEENT: Negative for trouble hearing, trouble seeing, ringing in ears, mouth sores, hoarseness, change in voice, dysphagia. CV:  Negative for chest pain, dyspnea, edema, palpitations Resp: Negative for cough, dyspnea, hemoptysis GI: Negative for nausea, vomiting, diarrhea, constipation, abdominal pain, melena, hematochezia. GU:  Negative for dysuria, incontinence, urinary hesitance, hematuria, vaginal or penile discharge, polyuria, sexual difficulty, lumps in testicle or breasts MSK: Negative for muscle cramps or aches, joint pain or swelling Neuro: Negative for headaches, weakness, numbness, dizziness, passing out/fainting Psych: Negative for depression, anxiety, memory problems  Objective  Physical Exam Vitals:   04/20/17 1429  BP: 120/62  Pulse: (!) 56  Temp: 98.3 F (36.8 C)   Laying blood pressure 170/70 pulse 62 Sitting blood pressure 170/70 pulse 64 Standing blood pressure 160/68 pulse 67  BP Readings from Last 3 Encounters:  04/20/17 120/62  10/29/16 (!) 150/67  10/31/15 (!) 180/68   Wt Readings from Last 3 Encounters:  04/20/17 192 lb 3.2 oz (87.2 kg)  10/29/16 189 lb (85.7 kg)  10/31/15 192 lb (87.1 kg)    Physical Exam  Constitutional: No distress.  HENT:  Head: Normocephalic and atraumatic.  Mouth/Throat: Oropharynx is clear and moist. No oropharyngeal exudate.  Cardiovascular: Normal rate, regular rhythm and normal heart sounds.   Pulmonary/Chest: Effort normal and breath sounds normal.  Abdominal: Soft. Bowel sounds are normal. He exhibits no distension. There is no tenderness. There is no rebound and no guarding.  Musculoskeletal: He exhibits no edema.  Neurological: He is alert. Gait normal.  5/5 strength in bilateral biceps, triceps, grip, quads, hamstrings, plantar and dorsiflexion, sensation to light touch intact in bilateral UE and LE  Skin: Skin is warm and dry. He is not diaphoretic.  Psychiatric: Mood and affect normal.     Assessment/Plan:   Benign hypertension Blood pressure seems to be fairly well controlled. We'll currently continue his medications. Wonder if maybe his carvedilol could be contributing to his exhaustion and we'll see what his echo reveals first.  Adult hypothyroidism Recheck TSH.  Chronic kidney disease (CKD), stage III (moderate) Recheck  kidney function.  Exhaustion Patient has been evaluated by his cardiologist for this and plans on having an echo next week. I agree that this is probably the first step and if this is unremarkable we may need to reevaluate. We will obtain some lab work today to evaluate his anemia, kidney function, and thyroid. Symptoms may be related to his carvedilol as this can cause fatigue though I would like to see what his echo reveals prior to changing any medications. We'll monitor. He is given return precautions.  Overweight Patient reports he has gained a few pounds recently. He wants to know what he can do to change this. Based on discussing his diet it doesn't sound as though he has a great grasp of what a healthy diet might be. He is limited by his exhaustion with how much activity he can do at this time. I discussed dietary changes and gave him dietary instructions.   Orders Placed This Encounter  Procedures  . CBC  . Comp Met (CMET)  . TSH  . Sed Rate (ESR)   Tommi Rumps, MD [place]

## 2017-06-13 ENCOUNTER — Other Ambulatory Visit: Payer: Self-pay | Admitting: Family Medicine

## 2017-06-20 ENCOUNTER — Other Ambulatory Visit (INDEPENDENT_AMBULATORY_CARE_PROVIDER_SITE_OTHER): Payer: Self-pay | Admitting: Vascular Surgery

## 2017-06-20 DIAGNOSIS — I779 Disorder of arteries and arterioles, unspecified: Secondary | ICD-10-CM

## 2017-06-20 DIAGNOSIS — I739 Peripheral vascular disease, unspecified: Secondary | ICD-10-CM

## 2017-06-20 DIAGNOSIS — I714 Abdominal aortic aneurysm, without rupture, unspecified: Secondary | ICD-10-CM

## 2017-06-22 ENCOUNTER — Ambulatory Visit (INDEPENDENT_AMBULATORY_CARE_PROVIDER_SITE_OTHER): Payer: Self-pay | Admitting: Vascular Surgery

## 2017-06-22 ENCOUNTER — Ambulatory Visit (INDEPENDENT_AMBULATORY_CARE_PROVIDER_SITE_OTHER): Payer: Medicare Other

## 2017-06-22 DIAGNOSIS — I779 Disorder of arteries and arterioles, unspecified: Secondary | ICD-10-CM

## 2017-06-22 DIAGNOSIS — I714 Abdominal aortic aneurysm, without rupture, unspecified: Secondary | ICD-10-CM

## 2017-06-22 DIAGNOSIS — I739 Peripheral vascular disease, unspecified: Secondary | ICD-10-CM

## 2017-06-23 ENCOUNTER — Other Ambulatory Visit: Payer: Self-pay | Admitting: Family Medicine

## 2017-06-23 NOTE — Telephone Encounter (Signed)
Last OV 04/20/17 filed under historical 

## 2017-06-27 ENCOUNTER — Other Ambulatory Visit: Payer: Self-pay | Admitting: Family Medicine

## 2017-06-27 NOTE — Telephone Encounter (Signed)
Pt called about needing a refill for amLODipine (NORVASC) 5 MG tablet  Pharmacy is [facility], [place]  Call pt @ [phone]. Thank you!

## 2017-06-28 ENCOUNTER — Other Ambulatory Visit: Payer: Self-pay | Admitting: Family Medicine

## 2017-06-28 MED ORDER — AMLODIPINE BESYLATE 5 MG PO TABS: 5.0000 mg | ORAL_TABLET | Freq: Every day | ORAL | 1 refills | Status: DC

## 2017-06-28 NOTE — Telephone Encounter (Signed)
Pt has requested to have this script filled , he only has one dose remaining .

## 2017-06-28 NOTE — Telephone Encounter (Signed)
Last OV 04/20/17 filed under hisotrical

## 2017-06-30 ENCOUNTER — Encounter (INDEPENDENT_AMBULATORY_CARE_PROVIDER_SITE_OTHER): Payer: Self-pay | Admitting: Vascular Surgery

## 2017-07-08 ENCOUNTER — Emergency Department
Admission: EM | Admit: 2017-07-08 | Discharge: 2017-07-08 | Disposition: A | Discharge status: Home / Self Care | Payer: Medicare Other | Attending: Emergency Medicine | Admitting: Emergency Medicine

## 2017-07-08 ENCOUNTER — Encounter: Payer: Self-pay | Admitting: Emergency Medicine

## 2017-07-08 DIAGNOSIS — I10 Essential (primary) hypertension: Secondary | ICD-10-CM

## 2017-07-08 DIAGNOSIS — E039 Hypothyroidism, unspecified: Secondary | ICD-10-CM | POA: Insufficient documentation

## 2017-07-08 DIAGNOSIS — R42 Dizziness and giddiness: Secondary | ICD-10-CM

## 2017-07-08 DIAGNOSIS — I25810 Atherosclerosis of coronary artery bypass graft(s) without angina pectoris: Secondary | ICD-10-CM | POA: Diagnosis not present

## 2017-07-08 DIAGNOSIS — N183 Chronic kidney disease, stage 3 (moderate): Secondary | ICD-10-CM | POA: Insufficient documentation

## 2017-07-08 DIAGNOSIS — I69998 Other sequelae following unspecified cerebrovascular disease: Secondary | ICD-10-CM | POA: Insufficient documentation

## 2017-07-08 DIAGNOSIS — Z85828 Personal history of other malignant neoplasm of skin: Secondary | ICD-10-CM | POA: Diagnosis not present

## 2017-07-08 DIAGNOSIS — I129 Hypertensive chronic kidney disease with stage 1 through stage 4 chronic kidney disease, or unspecified chronic kidney disease: Secondary | ICD-10-CM | POA: Insufficient documentation

## 2017-07-08 DIAGNOSIS — Z87891 Personal history of nicotine dependence: Secondary | ICD-10-CM | POA: Diagnosis not present

## 2017-07-08 DIAGNOSIS — Z79899 Other long term (current) drug therapy: Secondary | ICD-10-CM | POA: Insufficient documentation

## 2017-07-08 DIAGNOSIS — H539 Unspecified visual disturbance: Secondary | ICD-10-CM | POA: Insufficient documentation

## 2017-07-08 HISTORY — DX: Essential (primary) hypertension: I10

## 2017-07-08 LAB — CBC
HEMATOCRIT: 36.8 % — AB (ref 40.0–52.0)
Hemoglobin: 12.9 g/dL — ABNORMAL LOW (ref 13.0–18.0)
MCH: 31.4 pg (ref 26.0–34.0)
MCHC: 35.1 g/dL (ref 32.0–36.0)
MCV: 89.4 fL (ref 80.0–100.0)
PLATELETS: 179 10*3/uL (ref 150–440)
RBC: 4.12 MIL/uL — ABNORMAL LOW (ref 4.40–5.90)
RDW: 14.8 % — AB (ref 11.5–14.5)
WBC: 5.3 10*3/uL (ref 3.8–10.6)

## 2017-07-08 LAB — BASIC METABOLIC PANEL
Anion gap: 6 (ref 5–15)
BUN: 34 mg/dL — AB (ref 6–20)
CALCIUM: 9.8 mg/dL (ref 8.9–10.3)
CO2: 26 mmol/L (ref 22–32)
Chloride: 106 mmol/L (ref 101–111)
Creatinine, Ser: 1.75 mg/dL — ABNORMAL HIGH (ref 0.61–1.24)
GFR calc Af Amer: 37 mL/min — ABNORMAL LOW (ref >60)
GFR, EST NON AFRICAN AMERICAN: 32 mL/min — AB (ref >60)
GLUCOSE: 126 mg/dL — AB (ref 65–99)
POTASSIUM: 4.3 mmol/L (ref 3.5–5.1)
SODIUM: 138 mmol/L (ref 135–145)

## 2017-07-08 LAB — TROPONIN I

## 2017-07-08 MED ORDER — AMLODIPINE BESYLATE 5 MG PO TABS
5.0000 mg | ORAL_TABLET | Freq: Once | ORAL | Status: AC
  Administered 2017-07-08: 5 mg via ORAL
  Filled 2017-07-08: qty 1

## 2017-07-08 NOTE — ED Provider Notes (Signed)
[facility] Emergency Department Provider Note  ____________________________________________   First MD Initiated Contact with Patient 07/08/17 1740     (approximate)  I have reviewed the triage vital signs and the nursing notes.   HISTORY  Chief Complaint Hypertension and Dizziness    HPI Cody Blackburn is a 83 y.o. male with medical history as listed below who presents for evaluation of elevated blood pressure.  He reports that he has been intermittently lightheaded or "dizzy" for the last couple of weeks, he thinks as result of doing some aggressive exercise classes.  He has been eating and drinking fine.  He denies any pain.  His symptoms are mild.  However he was told by his primary care doctor to check his blood pressure regularly and today at home his systolic pressure was 062.  This is a little bit elevated over baseline it made him concerned.  He continued to check his blood pressure multiple times and it went up every time he checked it.  He reports that he tried calling his primary care doctor but he did not receive a call back.  He went to the [facility] walk-in clinic and his blood pressure continued to elevate so they sent him to the emergency department.  He is ambulating without difficulty and drove his own car to the ED.  He denies chest pain, shortness of breath, nausea, vomiting, abdominal pain, headache, visual disturbances.  He is concerned about his blood pressure and considers it severe,but it seems relatively gradual in onset and he adamantly denies any other symptoms.  Nothing in particular makes the patient's symptoms better nor worse.    Past Medical History:  Diagnosis Date  . Anxiety and depression   . BPH (benign prostatic hyperplasia)   . GERD (gastroesophageal reflux disease)   . Hypertension   . Incomplete bladder emptying   . Skin cancer   . Sleep apnea   . Stomach ulcer   . TIA (transient ischemic attack)     Patient  Active Problem List   Diagnosis Date Noted  . Exhaustion 04/20/2017  . Overweight 04/20/2017  . Anxiety 10/31/2015  . Borderline diabetes mellitus 10/31/2015  . Recurrent major depression in remission ([place]) 10/31/2015  . Blood glucose elevated 08/06/2015  . Benign essential tremor 04/18/2015  . Borderline diabetes 04/18/2015  . Clinical depression 04/18/2015  . Cerebrovascular accident, old 04/18/2015  . Adult hypothyroidism 04/18/2015  . Obstructive apnea 04/18/2015  . Panic attack 04/18/2015  . Gastroduodenal ulcer 04/18/2015  . Dizziness 03/18/2015  . Abnormal vision as late effect of cerebrovascular disease 02/26/2015  . Anxiety and depression 02/15/2015  . BPH with obstruction/lower urinary tract symptoms 02/15/2015  . Acid reflux 02/15/2015  . Incomplete bladder emptying 02/15/2015  . CA of skin 02/15/2015  . Apnea, sleep 02/15/2015  . Gastric ulcer 02/15/2015  . Temporary cerebral vascular dysfunction 02/15/2015  . Chronic kidney disease (CKD), stage III (moderate) 09/25/2014  . Benign hypertension 02/20/2014  . 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 CAD in native artery 02/20/2014  . Hypercholesterolemia without hypertriglyceridemia 02/20/2014  . Pure hypercholesterolemia 02/20/2014    Past Surgical History:  Procedure Laterality Date  . CARDIAC SURGERY  2005  . CARDIAC SURGERY  1977    Prior to Admission medications   Medication Sig Start Date End Date Taking? Authorizing Provider  amLODipine (NORVASC) 5 MG tablet Take 1 tablet (5 mg total) by mouth daily. 06/28/17   Tommi Rumps  G, MD  Ascorbic Acid (VITAMIN C) 100 MG tablet Take 100 mg by mouth daily.    [provider]  Calcipotriene 0.005 % solution  02/24/17   [provider]  Calcium Polycarbophil (FIBER-CAPS PO) Take by mouth.    [provider]  carvedilol (COREG) 3.125 MG tablet Take by mouth.    [provider]  clobetasol  (TEMOVATE) 0.05 % external solution  02/24/17   [provider]  Coenzyme Q10 (CO Q 10) 100 MG CAPS Take by mouth.    [provider]  finasteride (PROSCAR) 5 MG tablet Take 1 tablet (5 mg total) by mouth daily. 10/29/16   [name], MD  fluticasone [facility]) 50 MCG/ACT nasal spray Place into the nose.    [provider]  gabapentin (NEURONTIN) 100 MG capsule Take 100 mg by mouth daily. 03/07/17 05/06/17  [provider]  hydrochlorothiazide (HYDRODIURIL) 12.5 MG tablet Take by mouth.    [provider]  levothyroxine (SYNTHROID, LEVOTHROID) 88 MCG tablet TAKE ONE TABLET BY MOUTH EVERY DAY 06/24/17   [name], MD  loratadine (CLARITIN) 10 MG tablet Take by mouth.    [provider]  losartan (COZAAR) 50 MG tablet Take by mouth.    [provider]  Misc Natural Products (SAW PALMETTO) CAPS Take by mouth.    [provider]  MULTIPLE VITAMINS-MINERALS ER PO Take by mouth.    [provider]  nitroGLYCERIN (NITROSTAT) 0.4 MG SL tablet Place under the tongue.    [provider]  OMEGA-3 FATTY ACIDS PO Take by mouth.    [provider]  omeprazole (PRILOSEC) 40 MG capsule Take by mouth.    [provider]  polyethylene glycol powder (GLYCOLAX/MIRALAX) powder Take by mouth.    [provider]  pravastatin (PRAVACHOL) 40 MG tablet Take by mouth.    [provider]  Saw Palmetto 1000 MG CAPS Take by mouth.    [provider]  tamsulosin (FLOMAX) 0.4 MG CAPS capsule Take 2 capsules (0.8 mg total) by mouth daily. 10/29/16   [name], MD  triamcinolone cream (KENALOG) 0.1 %  05/10/15   [provider]    Allergies Baycol  [cerivastatin] and Zocor  [simvastatin]  Family History  Problem Relation Age of Onset  . Cancer Neg Hx        Bladder, Kideny and Prostate  . Prostate cancer Neg Hx     Social History Social History  Substance Use  Topics  . Smoking status: Former Research scientist (life sciences)  . Smokeless tobacco: Never Used  . Alcohol use Yes     Comment: occasional    Review of Systems Constitutional: No fever/chills. concerned today about elevated blood pressure Eyes: No visual changes. ENT: No sore throat. Cardiovascular: Denies chest pain. Respiratory: Denies shortness of breath. Gastrointestinal: No abdominal pain.  No nausea, no vomiting.  No diarrhea.  No constipation. Genitourinary: Negative for dysuria. Musculoskeletal: Negative for neck pain.  Negative for back pain. Integumentary: Negative for rash. Neurological: Negative for headaches, focal weakness or numbness. intermittent "dizziness" for a couple of weeks.   ____________________________________________   PHYSICAL EXAM:  VITAL SIGNS: ED Triage Vitals [07/08/17 1302]  Enc Vitals Group     BP (!) 172/58     Pulse Rate (!) 59     Resp 16     Temp 97.6 F (36.4 C)     Temp Source Oral     SpO2 97 %     Weight  86.2 kg (190 lb)     Height 1.829 m (6')     Head Circumference      Peak Flow      Pain Score      Pain Loc      Pain Edu?      Excl. in [place]?     Constitutional: Alert and oriented. Well appearing and in no acute distress. appears much younger than chronological age (by decades) Eyes: Conjunctivae are normal.  Head: Atraumatic. Nose: No congestion/rhinnorhea. Mouth/Throat: Mucous membranes are moist. Neck: No stridor.  No meningeal signs.   Cardiovascular: Normal rate, regular rhythm. Good peripheral circulation. Grossly normal heart sounds. Respiratory: Normal respiratory effort.  No retractions. Lungs CTAB. Gastrointestinal: Soft and nontender. No distention.  Musculoskeletal: No lower extremity tenderness nor edema. No gross deformities of extremities. Neurologic:  Normal speech and language. No gross focal neurologic deficits are appreciated.  Skin:  Skin is warm, dry and intact. No rash noted. Psychiatric: Mood and affect are normal.  Speech and behavior are normal.  ____________________________________________   LABS (all labs ordered are listed, but only abnormal results are displayed)  Labs Reviewed  BASIC METABOLIC PANEL - Abnormal; Notable for the following:       Result Value   Glucose, Bld 126 (*)    BUN 34 (*)    Creatinine, Ser 1.75 (*)    GFR calc non Af Amer 32 (*)    GFR calc Af Amer 37 (*)    All other components within normal limits  CBC - Abnormal; Notable for the following:    RBC 4.12 (*)    Hemoglobin 12.9 (*)    HCT 36.8 (*)    RDW 14.8 (*)    All other components within normal limits  TROPONIN I  URINALYSIS, COMPLETE (UACMP) WITH MICROSCOPIC  CBG MONITORING, ED   ____________________________________________  EKG  ED ECG REPORT I, Travers Goodley, the attending physician, personally viewed and interpreted this ECG.  Date: 07/08/2017 EKG Time: 12:59 Rate: 59 Rhythm: sinus rhythm with RBBB QRS Axis: normal Intervals: normal ST/T Wave abnormalities: Non-specific ST segment / T-wave changes, but no evidence of acute ischemia. Narrative Interpretation: unremarkable  ____________________________________________  RADIOLOGY   No results found.  ____________________________________________   PROCEDURES  Critical Care performed: No   Procedure(s) performed:   Procedures   ____________________________________________   INITIAL IMPRESSION / ASSESSMENT AND PLAN / ED COURSE  Pertinent labs & imaging results that were available during my care of the patient were reviewed by me and considered in my medical decision making (see chart for details).  this is a very active and capable and sharp 83 year old.  He is in no distress but is concerned about his blood pressure given what doctors have told him in the past and a history of CVA.  He has on at least 4 antihypertensives.  His blood pressure has come down slightly in the emergency department although it is still elevated from  his baseline.  However he has no focal neurological deficits at all, no ataxia, no vertigo, no cerebellar dysfunction.  I had a long conversation with him and his son, my usual and customary discussion regarding asymptomatic hypertension.  The patient is eager to go home and just wanted reassurance, and I explained that making changes to his medications for asymptomatic hypertension could do more harm than good and he is comfortable following up as an outpatient.  I gave him an extra dose of amlodipine 5 mg by mouth and encouraged  him to take his regular medications tonight and follow up either with his PCP or with his cardiologist at the next available opportunity.  I gave my usual and customary return precautions.    ( of note, we discussed the fact that his kidney function is actually improved from normal, which is reassuring.)      ____________________________________________  FINAL CLINICAL IMPRESSION(S) / ED DIAGNOSES  Final diagnoses:  Essential hypertension  Lightheadedness     MEDICATIONS GIVEN DURING THIS VISIT:  Medications  amLODipine (NORVASC) tablet 5 mg (5 mg Oral Given 07/08/17 1823)     NEW OUTPATIENT MEDICATIONS STARTED DURING THIS VISIT:  Discharge Medication List as of 07/08/2017  6:06 PM      Discharge Medication List as of 07/08/2017  6:06 PM      Discharge Medication List as of 07/08/2017  6:06 PM       Note:  This document was prepared using Dragon voice recognition software and may include unintentional dictation errors.    Hinda Kehr, MD 07/08/17 [phone]

## 2017-07-08 NOTE — Discharge Instructions (Signed)

## 2017-07-08 NOTE — ED Triage Notes (Signed)
C/O dizziness x 2 weeks and today blood pressure was elevated, SBP 150, which is high for patient.  Patient sent from [facility] for ED evaluation.

## 2017-07-15 ENCOUNTER — Telehealth: Payer: Self-pay | Admitting: *Deleted

## 2017-07-15 DIAGNOSIS — R2689 Other abnormalities of gait and mobility: Secondary | ICD-10-CM

## 2017-07-15 NOTE — Telephone Encounter (Signed)
Please advise 

## 2017-07-15 NOTE — Telephone Encounter (Signed)
Pt has requested referral for physical therapy at [facility] strictly for balance.   Pt contact [phone]

## 2017-07-16 NOTE — Telephone Encounter (Signed)
Ordered

## 2017-07-27 ENCOUNTER — Ambulatory Visit: Payer: Medicare Other | Attending: Family Medicine | Admitting: Physical Therapy

## 2017-07-27 ENCOUNTER — Encounter: Payer: Self-pay | Admitting: Physical Therapy

## 2017-07-27 DIAGNOSIS — R2681 Unsteadiness on feet: Secondary | ICD-10-CM | POA: Insufficient documentation

## 2017-07-27 DIAGNOSIS — M6281 Muscle weakness (generalized): Secondary | ICD-10-CM | POA: Diagnosis present

## 2017-07-27 NOTE — Therapy (Signed)
[place] MAIN [facility] SERVICES [address] [place], [place], [address] Phone: [phone]   Fax:  [phone]  Physical Therapy Evaluation  Patient Details  Name: Cody Blackburn MRN: [id number] Date of Birth: [birth year] Referring Provider: Dr. Caryl Bis  Encounter Date: 07/27/2017      PT End of Session - 07/27/17 1000    Visit Number 1   Number of Visits 17   Date for PT Re-Evaluation 10/09/2017   Authorization Type G-codes    Authorization Time Period 1   Authorization - Visit Number 10   PT Start Time 4496   PT Stop Time 0949   PT Time Calculation (min) 62 min   Equipment Utilized During Treatment Gait belt   Activity Tolerance Patient tolerated treatment well;Patient limited by fatigue;No increased pain   Behavior During Therapy [facility] for tasks assessed/performed;Anxious      Past Medical History:  Diagnosis Date  . Anxiety and depression   . BPH (benign prostatic hyperplasia)   . GERD (gastroesophageal reflux disease)   . Hypertension   . Incomplete bladder emptying   . Skin cancer    In remission for several years; bianual dermatologist apponitments  . Sleep apnea   . Stomach ulcer   . TIA (transient ischemic attack)     Past Surgical History:  Procedure Laterality Date  . CARDIAC SURGERY  2005  . CARDIAC SURGERY  1977    There were no vitals filed for this visit.       Subjective Assessment - 07/27/17 0853    Subjective Pt feels his balance is getting progressively worse over the last few weeks. He is concerned about falling and wants to be proactive with therapy. Pt is able to drive independently    Pertinent History 83 y/o male presents with a referral for balance deficits. Pt started doing cardiorespiratory exercise about a year or two ago, but was feeling increased fatigue and switched to yoga. About 3 weeks ago he started cardio exercise at the gym again, and again had severe fatigue. He has had a feelings  lightheadedness and dizziness over the last few weeks. Pt ambulates with [name] for community ambulation, but uses no AD in his home. He reports difficulty and fear with walking over steps, such as the 2 steps to access his kitchen. Pt had recent ED visit on 8/24 due to personal concerns over his elevated BP with systolic in the 759F. The ED physician note reported that pt's BP was "a little bit elevated over baseline." Pt checks his BP regularly at home. Pt had a recent visit with his cardiologist on 05/16/17; cardiologist reports that pt's BP is "fairly well controlled".  Pt had a stroke in 2014 with residual balance deficits. Medical Hx is significant for stage III CKD, skin cancer (in remission), and anxiety/depression.    Limitations Walking;House hold activities   How long can you sit comfortably? No limitations    How long can you stand comfortably? Long enough to do yoga, 10-20 min   How long can you walk comfortably? several hunderd feet; pt is limited by leg pain.   Diagnostic tests --   Patient Stated Goals To improve balance    Currently in Pain? No/denies   Multiple Pain Sites No            [facility] PT Assessment - 07/27/17 0001      Assessment   Medical Diagnosis Balance deficits   Referring Provider Dr. Caryl Bis   Onset  Date/Surgical Date --  Worse balance since stroke in 2014   Hand Dominance Left   Next MD Visit 10/11/17   Prior Therapy Prior therapy for balance several years ago with successful rehab     Precautions   Precautions Fall     Restrictions   Weight Bearing Restrictions No     Balance Screen   Has the patient fallen in the past 6 months No  Prior falls with sudden turns   Has the patient had a decrease in activity level because of a fear of falling?  Yes   Is the patient reluctant to leave their home because of a fear of falling?  Yes     [place] Private residence   Living Arrangements Alone   Type of [place] lives adjacent    [place] to enter   Entrance Stairs-Number of Steps 6  Two steps to get into kitchen   Entrance Stairs-Rails Can reach both   [place] One level   [place] - single point     Prior Function   Level of Independence Independent with basic ADLs   Vocation Retired   Leisure Clinical cytogeneticist   Overall Cognitive Status Within Functional Limits for tasks assessed   Memory Impaired   Memory Impairment Decreased short term memory     Functional Gait  Assessment   Gait assessed  Yes   Gait Level Surface Walks 20 ft, slow speed, abnormal gait pattern, evidence for imbalance or deviates 10-15 in outside of the 12 in walkway width. Requires more than 7 sec to ambulate 20 ft.   Change in Gait Speed Able to change speed, demonstrates mild gait deviations, deviates 6-10 in outside of the 12 in walkway width, or no gait deviations, unable to achieve a major change in velocity, or uses a change in velocity, or uses an assistive device.   Gait with Horizontal Head Turns Performs head turns with moderate changes in gait velocity, slows down, deviates 10-15 in outside 12 in walkway width but recovers, can continue to walk.   Gait with Vertical Head Turns Performs task with severe disruption of gait (eg, staggers 15 in outside 12 in walkway width, loses balance, stops, reaches for wall).   Gait and Pivot Turn Turns slowly, requires verbal cueing, or requires several small steps to catch balance following turn and stop   Step Over Obstacle Is able to step over one shoe box (4.5 in total height) but must slow down and adjust steps to clear box safely. May require verbal cueing.   Gait with Narrow Base of Support Ambulates 4-7 steps.   Gait with Eyes Closed Walks 20 ft, uses assistive device, slower speed, mild gait deviations, deviates 6-10 in outside 12 in walkway width. Ambulates 20 ft in less than 9 sec but greater than 7 sec.   Ambulating Backwards  Walks 20 ft, slow speed, abnormal gait pattern, evidence for imbalance, deviates 10-15 in outside 12 in walkway width.   Steps Alternating feet, must use rail.   Total Score 12   FGA comment: Score indicated pt is high fall risk          PROM/AROM:  Upper Extremity: WFL  Lower extremity: Grossly WFL   STRENGTH:  Graded on a 0-5 scale Muscle Group Left Right  Gross Upper Extremity 4+ 4+  Hip Flex 4+ 4+  Hip Abd 4 4  Hip Add 4+  4+          Knee Flex 5 4  Knee Ext 5 5  Ankle DF 4 4       Sensory:  Light touch: Pt reports intermittent tingling in UEs and LEs, but not numbness or decreased sensation to light touch with gross screen of LEs     Coordination:   Not impaired by gross functional assessment.  Observations:   Posture: Forward head, increased kyphosis, and rounded shoulders. Pt sacral sits with mildly slouched posture, which he can correct with cueing.    BALANCE:    Pt assessed with perturbations in Romberg stance: he is able to withstand min-mod perturbation, though he looses his balance with quick release of pressure causing a step response and requiring minA to regain balance.  Static Standing Balance  Normal Able to maintain standing balance against maximal resistance   Good Able to maintain standing balance against moderate resistance   Good-/Fair+ Able to maintain standing balance against minimal resistance   Fair Able to stand unsupported without UE support and without LOB for 1-2 min X  Fair- Requires Min A and UE support to maintain standing without loss of balance   Poor+ Requires mod A and UE support to maintain standing without loss of balance   Poor Requires max A and UE support to maintain standing balance without loss     Standing Dynamic Balance  Normal Stand independently unsupported, able to weight shift and cross midline maximally   Good Stand independently unsupported, able to weight shift and cross midline moderately   Good-/Fair+  Stand independently unsupported, able to weight shift across midline minimally X  Fair Stand independently unsupported, weight shift, and reach ipsilaterally, loss of balance when crossing midline   Poor+ Able to stand with Min A and reach ipsilaterally, unable to weight shift   Poor+ Able to stand with Mod A and minimally reach ipsilaterally, unable to cross midline.      FUNCTIONAL MOBILITY:   Transfers: Pt able to stand repeatedly without use of UEs, though he did have instability with initial standing during 5x sit<>stand due to increased speed of sit<>stand and required minA to prevent posterior LOB during test. Pt needs no assist with sit<>stand when performed at comfortable speed.   Gait: Pt ambulates with [employer] with reciprocating gait pattern. He appears mildly unsteady with decreased postural control and occasional corrective stepping to regain balance, which is especially apparent with directional changes. Pt uses cane with inconsistent sequencing. Pt ambulates with forward head, increased kyphosis, and rounded shoulders.    OUTCOME MEASURES: TEST Outcome Interpretation  5 times sit<>stand  12.53 sec without use of UEs Pt > 29 y/o is not at increased fall risk (>15 sec) Pt required minA to prevent posterior LOB during test. Test should be repeated due to pt requiring assist    10 meter walk test                 1.8m/s with [name] and SBA  Community amulator        DHI                80/100 Severe perception of disability   FGA                 12/30 Pt is at increased risk for falls (score <19)  ABC                40 /100  40% disability  Objective measurements completed on examination: See above findings.     Treatment:  Ther-ex:   Sitting t-band exercises using green t-band including:   LAQ 2 x 10 each LE; mod cues to slow down, especially with eccentric movement   Hip abduction 2 x 10, cues to isolate movement and keep foot under  knee   March 2 x 10, cues to increase AROM   Ankle DF/PF 2 x 10 with cues to alternate with opposite movement on each LE to stretch band further for increased resistance             PT Education - 07/27/17 1000    Education provided Yes   Education Details Plan of care, outcome interpretation, ther-ex   Person(s) Educated Patient   Methods Explanation;Tactile cues;Demonstration;Verbal cues;Handout   Comprehension Tactile cues required;Verbal cues required;Returned demonstration;Verbalized understanding          PT Short Term Goals - 07/27/17 1041      PT SHORT TERM GOAL #1   Title Pt will demonstrate proper sequencing with use of AD with 2 point gait pattern to improve safety and stability in gait.   Baseline Uses AD inconsistently without proprer sequencing    Time 4   Period Weeks   Status New   Target Date 08/24/17     PT SHORT TERM GOAL #2   Title Pt will perform HEP at least 3/7 days/wk in order to show meaningful improvement in strength and balance for improved functional mobility and safety.   Time 4   Period Weeks   Status New   Target Date 08/24/17           PT Long Term Goals - 07/27/17 1036      PT LONG TERM GOAL #1   Title Patient will be independent in home exercise program to improve strength/mobility for better functional independence with ADLs.   Time 8   Period Weeks   Status New   Target Date 09/21/17     PT LONG TERM GOAL #2   Title Patient will increase ABC scale score >80% to demonstrate better functional mobility and better confidence with ADLs.    Baseline 40% functioning indicating low level of physical functioning   Time 8   Period Weeks   Status New   Target Date 09/21/17     PT LONG TERM GOAL #3   Title Patient will increase Functional Gait Assessment score to >20/30 as to reduce fall risk and improve dynamic gait safety with community ambulation.   Baseline 12/30 indicating high fall risk   Time 8   Period Weeks    Status New   Target Date 09/21/17     PT LONG TERM GOAL #4   Title Patient will reduce dizziness handicap inventory score to <50, for less dizziness with ADLs and increased safety with home and work tasks.    Baseline score = 80 indicating severe disability   Time 8   Period Weeks   Status New   Target Date 09/21/17     PT LONG TERM GOAL #5   Title Patient will increase BLE gross strength to 4+/5 as to improve functional strength for independent gait, increased standing tolerance and increased ADL ability.   Baseline Grossly 4 to 4+/5   Time 8   Period Weeks   Status New   Target Date 09/21/17                Plan - 07/27/17 1002  Clinical Impression Statement 83 y/o male presents to therapy with balance and strength deficits. Pt asked his primary care physician for a referral to PT due to feel that his balance has been worsening. Pt was assessed with outcomes indicating that pt is at a high fall risk based on Functional gait assessment (FGA). Based on the dizziness handicap index pt has a severe perception of disability. Activities specific balance confidence scale indicates pt perceives he is 60% disabled. Pt is capable of community ambulating speed (gait speed = 0.96m/s), and is able to perform 5 x sit<>stand without indication of increased fall risk based on his time. He does, however have minor instability while performing test. Pt has mild strength deficits with MMT. Pt attends yoga classes weekly, but has been unable to attend his fitness classes due to severe fatigue. Pt is appropriate for skilled therapy at this time to address his balance and strength deficits and to maximize his functional mobility and safety   History and Personal Factors relevant to plan of care: (-) Lives alone, stairs to enter home and 2 steps in home, advanced age, multiple comorbidities, Hx of anxiety and depression. (+) Independent with transportation, self motivated, success with prior therapy    Clinical Presentation Stable   Clinical Presentation due to: Fall risk without Hx of falls, comorbidities being managed, good safety awareness   Clinical Decision Making Low   Rehab Potential Good   Clinical Impairments Affecting Rehab Potential Anxiety and depression   PT Frequency 2x / week   PT Duration 8 weeks   PT Treatment/Interventions ADLs/Self Care Home Management;Aquatic Therapy;Biofeedback;Cryotherapy;Moist Heat;DME Instruction;Balance training;Therapeutic exercise;Therapeutic activities;Functional mobility training;Stair training;Gait training;Neuromuscular re-education;Cognitive remediation;Patient/family education;Manual techniques;Energy conservation;Visual/perceptual remediation/compensation   PT Next Visit Plan Advanced balance training, gait with daul task   PT Home Exercise Plan Sitting t-band, will advance with balance and standing resistance    Consulted and Agree with Plan of Care Patient      Patient will benefit from skilled therapeutic intervention in order to improve the following deficits and impairments:  Abnormal gait, Decreased activity tolerance, Decreased balance, Decreased mobility, Decreased knowledge of use of DME, Decreased strength, Difficulty walking, Dizziness, Impaired perceived functional ability, Improper body mechanics, Postural dysfunction  Visit Diagnosis: Unsteadiness on feet - Plan: PT plan of care cert/re-cert  Muscle weakness (generalized) - Plan: PT plan of care cert/re-cert      G-Codes - 16/10/96 1536    Functional Assessment Tool Used (Outpatient Only) FGA, 10 meter walk, 5 times sit<>Stand, clinical judgement;    Functional Limitation Mobility: Walking and moving around   Mobility: Walking and Moving Around Current Status [phone]) At least 20 percent but less than 40 percent impaired, limited or restricted   Mobility: Walking and Moving Around Goal Status [phone]) At least 1 percent but less than 20 percent impaired, limited or restricted        Problem List Patient Active Problem List   Diagnosis Date Noted  . Exhaustion 04/20/2017  . Overweight 04/20/2017  . Anxiety 10/31/2015  . Borderline diabetes mellitus 10/31/2015  . Recurrent major depression in remission ([place]) 10/31/2015  . Blood glucose elevated 08/06/2015  . Benign essential tremor 04/18/2015  . Borderline diabetes 04/18/2015  . Clinical depression 04/18/2015  . Cerebrovascular accident, old 04/18/2015  . Adult hypothyroidism 04/18/2015  . Obstructive apnea 04/18/2015  . Panic attack 04/18/2015  . Gastroduodenal ulcer 04/18/2015  . Dizziness 03/18/2015  . Abnormal vision as late effect of cerebrovascular disease 02/26/2015  . Anxiety and depression  02/15/2015  . BPH with obstruction/lower urinary tract symptoms 02/15/2015  . Acid reflux 02/15/2015  . Incomplete bladder emptying 02/15/2015  . CA of skin 02/15/2015  . Apnea, sleep 02/15/2015  . Gastric ulcer 02/15/2015  . Temporary cerebral vascular dysfunction 02/15/2015  . Chronic kidney disease (CKD), stage III (moderate) 09/25/2014  . Benign hypertension 02/20/2014  . 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 CAD in native artery 02/20/2014  . Hypercholesterolemia without hypertriglyceridemia 02/20/2014  . Pure hypercholesterolemia 02/20/2014   Burnett Corrente, SPT This entire session was performed under direct supervision and direction of a licensed therapist/therapist assistant . I have personally read, edited and approve of the note as written.  Trotter,Margaret PT, DPT 07/27/2017, 3:37 PM  [place] MAIN [facility] SERVICES [address] [place], [place], [address] Phone: [phone]   Fax:  [phone]  Name: Cody Blackburn MRN: [id number] Date of Birth: 1923-03-28

## 2017-07-27 NOTE — Patient Instructions (Addendum)
Knee Extension: Resisted (Sitting)   With band looped around right ankle and under other foot, straighten leg with ankle loop. Keep other leg bent to increase resistance. Repeat _10___ times per set. Do __2__ sets per session. Do _2___ sessions per day.  http://orth.exer.us/691   Copyright  VHI. All rights reserved.  ABDUCTION: Sitting - Exercise Ball: Resistance Band (Active)   Sit with feet flat. With band tied around both legs, Lift right leg slightly and, against resistance band, draw it out to side. Complete __2_ sets of __10_ repetitions. Perform _2__ sessions per day.  Copyright  VHI. All rights reserved.  FLEXION: Sitting - Resistance Band (Active)   Sit, both feet flat. Have band tied around both legs above knees, lift right knee toward ceiling.Repeat with other knee Complete _2__ sets of _10__ repetitions. Perform _2__ sessions per day.   Copyright  VHI. All rights reserved.  FLEXION: Sitting - Resistance Band (Active)   Sit with right foot flat. Have band tied around both feet, bend ankle, bringing toes toward head. Complete __2_ sets of __10_ repetitions. Perform _2__ sessions per day.

## 2017-08-01 ENCOUNTER — Ambulatory Visit: Payer: Medicare Other | Admitting: Physical Therapy

## 2017-08-01 ENCOUNTER — Encounter: Payer: Self-pay | Admitting: Physical Therapy

## 2017-08-01 ENCOUNTER — Ambulatory Visit: Payer: Medicare Other

## 2017-08-01 DIAGNOSIS — M6281 Muscle weakness (generalized): Secondary | ICD-10-CM

## 2017-08-01 DIAGNOSIS — R2681 Unsteadiness on feet: Secondary | ICD-10-CM | POA: Diagnosis not present

## 2017-08-01 NOTE — Therapy (Signed)
[place] MAIN [facility] SERVICES [address] [place], [place], [address] Phone: [phone]   Fax:  [phone]  Physical Therapy Treatment  Patient Details  Name: Cody Blackburn MRN: [id number] Date of Birth: June 19, 1923 Referring Provider: Dr. Caryl Bis  Encounter Date: 08/01/2017      PT End of Session - 08/01/17 1029    Visit Number 2   Number of Visits 17   Date for PT Re-Evaluation September 26, 2017   Authorization Type G-codes    Authorization Time Period 2   Authorization - Visit Number 10   PT Start Time [phone]   PT Stop Time 1015   PT Time Calculation (min) 41 min   Equipment Utilized During Treatment Gait belt   Activity Tolerance Patient tolerated treatment well;No increased pain   Behavior During Therapy [facility] for tasks assessed/performed;Anxious      Past Medical History:  Diagnosis Date  . Anxiety and depression   . BPH (benign prostatic hyperplasia)   . GERD (gastroesophageal reflux disease)   . Hypertension   . Incomplete bladder emptying   . Skin cancer    In remission for several years; bianual dermatologist apponitments  . Sleep apnea   . Stomach ulcer   . TIA (transient ischemic attack)     Past Surgical History:  Procedure Laterality Date  . CARDIAC SURGERY  2005  . CARDIAC SURGERY  1977    There were no vitals filed for this visit.      Subjective Assessment - 08/01/17 0937    Subjective Pt reports no change since last visit; no falls since last visit and no pain at this time.   Pertinent History 83 y/o male presents with a referral for balance deficits. Pt started doing cardiorespiratory exercise about a year or two ago, but was feeling increased fatigue and switched to yoga. About 3 weeks ago he started cardio exercise at the gym again, and again had severe fatigue. He has had a feelings lightheadedness and dizziness over the last few weeks. Pt ambulates with [name] for community ambulation, but uses no AD in  his home. He reports difficulty and fear with walking over steps, such as the 2 steps to access his kitchen. Pt had recent ED visit on 8/24 due to personal concerns over his elevated BP with systolic in the 546E. The ED physician note reported that pt's BP was "a little bit elevated over baseline." Pt checks his BP regularly at home. Pt had a recent visit with his cardiologist on 05/16/17; cardiologist reports that pt's BP is "fairly well controlled".  Pt had a stroke in 2014 with residual balance deficits. Medical Hx is significant for stage III CKD, skin cancer (in remission), and anxiety/depression.    Limitations Walking;House hold activities   How long can you sit comfortably? No limitations    How long can you stand comfortably? Long enough to do yoga, 10-20 min   How long can you walk comfortably? several hunderd feet; pt is limited by leg pain.   Patient Stated Goals To improve balance    Currently in Pain? No/denies          Treatment:  Neuro Re-education:   Standing in parallel bars:   Stance progression on firm surface including neutral stance, Romberg, semi-tandem, and tandem stance 2 x 30 sec each with head turns and ball passes; tandem stance 2 x 30 sec each LE forward. 2kg weighted ball passes in alternating directions with visual target for ball pass  in random location; pt had multiple LOB in semi-tandem stance requiring minA for balance recovery.    Ankle rocking on half bolster for increased ankle proprioception for improved balance. X 10 reps with posterior LOB on first 5 reps; pt cued to slow down and to shift weight forward with heel rock; pt able to maintain balance for last 3 reps with no LOB.    Toe taps x 20 on 2" foam to 5" step. Pt cued to use mirror for visual feedback. Pt with occasional lateral LOB and occasional toe drag on step.  Repeated with green dynadisc on step and cues to tap foot only with minimal weight shift; pt with increased challenge to balance and  occasional use of UE.    Ther-ex:  Standing:   March 2 x 20 reps alternating with 3# ankle weight; cues to use UEs    HEP updated with standing resisted there-ex using red t-band and UE support   Hip abduction resisted 2 x 10; cues to keep toes pointed forward to isolate motion and to avoid excessive lateral leaning   Hip ext resisted 2 x 10; cues to hold motion at end range for 1 sec; pt had difficulty controlling movement with LLE, this improved with cueing   Hip flexion 2 x 10; tactile cues to avoid posterior lean compensation; pt with good carryover of cueing from previous exercise.   With all above HEP pt was cued for increased eccentric control and isolation of movement with pt able to perform exercise correctly  Following cueing. Pt had increased difficulty performing resisted hip strengthening in standing requiring use of red band instead of green, which pt uses for seated exercise. Handout given for updated HEP.           PT Education - 08/01/17 1029    Education provided Yes   Education Details Ther-ex, HEP updated, balance training   Person(s) Educated Patient   Methods Explanation;Demonstration;Verbal cues;Handout;Tactile cues   Comprehension Verbal cues required;Returned demonstration;Verbalized understanding;Tactile cues required          PT Short Term Goals - 07/27/17 1041      PT SHORT TERM GOAL #1   Title Pt will demonstrate proper sequencing with use of AD with 2 point gait pattern to improve safety and stability in gait.   Baseline Uses AD inconsistently without proprer sequencing    Time 4   Period Weeks   Status New   Target Date 08/24/17     PT SHORT TERM GOAL #2   Title Pt will perform HEP at least 3/7 days/wk in order to show meaningful improvement in strength and balance for improved functional mobility and safety.   Time 4   Period Weeks   Status New   Target Date 08/24/17           PT Long Term Goals - 07/27/17 1036      PT LONG  TERM GOAL #1   Title Patient will be independent in home exercise program to improve strength/mobility for better functional independence with ADLs.   Time 8   Period Weeks   Status New   Target Date 09/21/17     PT LONG TERM GOAL #2   Title Patient will increase ABC scale score >80% to demonstrate better functional mobility and better confidence with ADLs.    Baseline 40% functioning indicating low level of physical functioning   Time 8   Period Weeks   Status New   Target Date 09/21/17  PT LONG TERM GOAL #3   Title Patient will increase Functional Gait Assessment score to >20/30 as to reduce fall risk and improve dynamic gait safety with community ambulation.   Baseline 12/30 indicating high fall risk   Time 8   Period Weeks   Status New   Target Date 09/21/17     PT LONG TERM GOAL #4   Title Patient will reduce dizziness handicap inventory score to <50, for less dizziness with ADLs and increased safety with home and work tasks.    Baseline score = 80 indicating severe disability   Time 8   Period Weeks   Status New   Target Date 09/21/17     PT LONG TERM GOAL #5   Title Patient will increase BLE gross strength to 4+/5 as to improve functional strength for independent gait, increased standing tolerance and increased ADL ability.   Baseline Grossly 4 to 4+/5   Time 8   Period Weeks   Status New   Target Date 09/21/17               Plan - 08/01/17 1030    Clinical Impression Statement Pt instructed in balance training in parallel bars with multiple LOB in tandem and semi-tandem dynamic balance. Pt tolerated balance training well with no pain. HEP updated and performed with standing resisted exercise for hip strengthening. Pt was able to perform all HEP with proper technique following cueing. Pt will benefit from continued skilled therapy in order to maximize functional mobility and safety.   Rehab Potential Good   Clinical Impairments Affecting Rehab Potential  Anxiety and depression   PT Frequency 2x / week   PT Duration 8 weeks   PT Treatment/Interventions ADLs/Self Care Home Management;Aquatic Therapy;Biofeedback;Cryotherapy;Moist Heat;DME Instruction;Balance training;Therapeutic exercise;Therapeutic activities;Functional mobility training;Stair training;Gait training;Neuromuscular re-education;Cognitive remediation;Patient/family education;Manual techniques;Energy conservation;Visual/perceptual remediation/compensation   PT Next Visit Plan Advanced balance training, gait with daul task   PT Home Exercise Plan Sitting t-band, will advance with balance and standing resistance    Consulted and Agree with Plan of Care Patient      Patient will benefit from skilled therapeutic intervention in order to improve the following deficits and impairments:  Abnormal gait, Decreased activity tolerance, Decreased balance, Decreased mobility, Decreased knowledge of use of DME, Decreased strength, Difficulty walking, Dizziness, Impaired perceived functional ability, Improper body mechanics, Postural dysfunction  Visit Diagnosis: Unsteadiness on feet  Muscle weakness (generalized)     Problem List Patient Active Problem List   Diagnosis Date Noted  . Exhaustion 04/20/2017  . Overweight 04/20/2017  . Anxiety 10/31/2015  . Borderline diabetes mellitus 10/31/2015  . Recurrent major depression in remission ([place]) 10/31/2015  . Blood glucose elevated 08/06/2015  . Benign essential tremor 04/18/2015  . Borderline diabetes 04/18/2015  . Clinical depression 04/18/2015  . Cerebrovascular accident, old 04/18/2015  . Adult hypothyroidism 04/18/2015  . Obstructive apnea 04/18/2015  . Panic attack 04/18/2015  . Gastroduodenal ulcer 04/18/2015  . Dizziness 03/18/2015  . Abnormal vision as late effect of cerebrovascular disease 02/26/2015  . Anxiety and depression 02/15/2015  . BPH with obstruction/lower urinary tract symptoms 02/15/2015  . Acid reflux  02/15/2015  . Incomplete bladder emptying 02/15/2015  . CA of skin 02/15/2015  . Apnea, sleep 02/15/2015  . Gastric ulcer 02/15/2015  . Temporary cerebral vascular dysfunction 02/15/2015  . Chronic kidney disease (CKD), stage III (moderate) 09/25/2014  . Benign hypertension 02/20/2014  . 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 CAD in  native artery 02/20/2014  . Hypercholesterolemia without hypertriglyceridemia 02/20/2014  . Pure hypercholesterolemia 02/20/2014   Burnett Corrente, SPT This entire session was performed under direct supervision and direction of a licensed therapist/therapist assistant . I have personally read, edited and approve of the note as written.  Trotter,Margaret PT, DPT 08/01/2017, 10:40 AM  [place] MAIN [facility] SERVICES [address] [place], [place], [address] Phone: [phone]   Fax:  [phone]  Name: Cody Blackburn MRN: [id number] Date of Birth: [birth year]

## 2017-08-01 NOTE — Patient Instructions (Addendum)
Perform all the below exercises with upper extremity support using kitchen counter or sturdy chair.      Balance, Proprioception: Hip Abduction With Red t-band   With tubing attached to both ankles, Standing holding onto counter, kick one leg out to side and then Return.  Repeat _10___ times  On each side.  Do ___2_ sessions per day.   Copyright  VHI. All rights reserved.  Balance, Proprioception: Hip Extension With red t-band   With tubing tied around both legs, holding onto kitchen counter, swing leg back. Return. Repeat _10___ times with 1 second hold. Do __2__ sessions per day.  http://cc.exer.us/19   Copyright  VHI. All rights reserved.  Balance, Proprioception: Hip Flexion With red t-band   With tubing attached to both ankles, swing leg forward. Return. Repeat _10___ times. Do __2__ sessions per day.  http://cc.exer.us/18   Copyright  VHI. All rights reserved.  Band Walk: Side Stepping   Tie band around legs, just above knees. Step _10__ feet to one side, then step back to start. Repeat _2-3__ laps per session. Note: Small towel between band and skin eases rubbing.

## 2017-08-03 ENCOUNTER — Ambulatory Visit: Payer: Medicare Other

## 2017-08-03 DIAGNOSIS — M6281 Muscle weakness (generalized): Secondary | ICD-10-CM

## 2017-08-03 DIAGNOSIS — R2681 Unsteadiness on feet: Secondary | ICD-10-CM

## 2017-08-03 NOTE — Therapy (Signed)
[place] MAIN [facility] SERVICES [address] [place], [place], [address] Phone: [phone]   Fax:  [phone]  Physical Therapy Treatment  Patient Details  Name: Cody Blackburn MRN: [id number] Date of Birth: 24-Aug-1923 Referring Provider: Dr. Caryl Bis  Encounter Date: 08/03/2017      PT End of Session - 08/03/17 1058    Visit Number 3   Number of Visits 17   Date for PT Re-Evaluation 10/14/17   Authorization Type G-codes    Authorization Time Period 3   Authorization - Visit Number 10   PT Start Time 1100   PT Stop Time 1145   PT Time Calculation (min) 45 min   Equipment Utilized During Treatment Gait belt   Activity Tolerance Patient tolerated treatment well;No increased pain   Behavior During Therapy [facility] for tasks assessed/performed;Anxious      Past Medical History:  Diagnosis Date  . Anxiety and depression   . BPH (benign prostatic hyperplasia)   . GERD (gastroesophageal reflux disease)   . Hypertension   . Incomplete bladder emptying   . Skin cancer    In remission for several years; bianual dermatologist apponitments  . Sleep apnea   . Stomach ulcer   . TIA (transient ischemic attack)     Past Surgical History:  Procedure Laterality Date  . CARDIAC SURGERY  2005  . CARDIAC SURGERY  1977    There were no vitals filed for this visit.      Subjective Assessment - 08/03/17 1150    Subjective Patient feeling down about difficulty with balance. Not feeling progress yet. 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    Pertinent History 83 y/o male presents with a referral for balance deficits. Pt started doing cardiorespiratory exercise about a year or two ago, but was feeling increased fatigue and switched to yoga. About 3 weeks ago he started cardio exercise at the gym again, and again had severe fatigue. He has had a feelings lightheadedness and dizziness over the last few weeks. Pt ambulates with [name]  for community ambulation, but uses no AD in his home. He reports difficulty and fear with walking over steps, such as the 2 steps to access his kitchen. Pt had recent ED visit on 8/24 due to personal concerns over his elevated BP with systolic in the 220U. The ED physician note reported that pt's BP was "a little bit elevated over baseline." Pt checks his BP regularly at home. Pt had a recent visit with his cardiologist on 05/16/17; cardiologist reports that pt's BP is "fairly well controlled".  Pt had a stroke in 2014 with residual balance deficits. Medical Hx is significant for stage III CKD, skin cancer (in remission), and anxiety/depression.    Limitations Walking;House hold activities   How long can you sit comfortably? No limitations    How long can you stand comfortably? Long enough to do yoga, 10-20 min   How long can you walk comfortably? several hunderd feet; pt is limited by leg pain.   Patient Stated Goals To improve balance    Currently in Pain? No/denies     TherEx 6" step ups 10x each leg in// bars SUE support ot no UE support Toe tap 6: step 20x no UE support  Eccentric step downs 6" step 10x each leg in // bars  BUE support   Neuro Re-ed VOR 1 x 3, increased dizziness, two LOB , performed in // bars.  Horizontal head  turns reading cards off wall, 200 ft, LOB x1, utilization of hurrycane, and CGA.  Airex pad : tossing balls into bucket no UE support, posterior lean noted airex pad tandem stance 2x90 seconds  [facility]one color tap with PT directed single UE support Side stepping without UE support // bars   Pt. Demonstrates posterior trunk lean and LOB.                          PT Education - 08/03/17 1151    Education provided Yes   Education Details the three dimentions of balance, VOR, ambulating with head turns   Person(s) Educated Patient   Methods Explanation;Demonstration;Verbal cues   Comprehension Verbalized understanding;Need further instruction           PT Short Term Goals - 07/27/17 1041      PT SHORT TERM GOAL #1   Title Pt will demonstrate proper sequencing with use of AD with 2 point gait pattern to improve safety and stability in gait.   Baseline Uses AD inconsistently without proprer sequencing    Time 4   Period Weeks   Status New   Target Date 08/24/17     PT SHORT TERM GOAL #2   Title Pt will perform HEP at least 3/7 days/wk in order to show meaningful improvement in strength and balance for improved functional mobility and safety.   Time 4   Period Weeks   Status New   Target Date 08/24/17           PT Long Term Goals - 07/27/17 1036      PT LONG TERM GOAL #1   Title Patient will be independent in home exercise program to improve strength/mobility for better functional independence with ADLs.   Time 8   Period Weeks   Status New   Target Date 09/21/17     PT LONG TERM GOAL #2   Title Patient will increase ABC scale score >80% to demonstrate better functional mobility and better confidence with ADLs.    Baseline 40% functioning indicating low level of physical functioning   Time 8   Period Weeks   Status New   Target Date 09/21/17     PT LONG TERM GOAL #3   Title Patient will increase Functional Gait Assessment score to >20/30 as to reduce fall risk and improve dynamic gait safety with community ambulation.   Baseline 12/30 indicating high fall risk   Time 8   Period Weeks   Status New   Target Date 09/21/17     PT LONG TERM GOAL #4   Title Patient will reduce dizziness handicap inventory score to <50, for less dizziness with ADLs and increased safety with home and work tasks.    Baseline score = 80 indicating severe disability   Time 8   Period Weeks   Status New   Target Date 09/21/17     PT LONG TERM GOAL #5   Title Patient will increase BLE gross strength to 4+/5 as to improve functional strength for independent gait, increased standing tolerance and increased ADL ability.   Baseline  Grossly 4 to 4+/5   Time 8   Period Weeks   Status New   Target Date 09/21/17               Plan - 08/03/17 1154    Clinical Impression Statement Patient educated on the three dimensions of balance. VOR induced dizziness upon performance. Patient did not  have frequent trunk sway on unstable surface. Weak LE's noted in stepping intervention. Continuation of VOR/head turns will benefit pt as well as strengthening of LE's. Patient will benefit from skilled physical therapy to maximize functional mobility and safety.    Rehab Potential Good   Clinical Impairments Affecting Rehab Potential Anxiety and depression   PT Frequency 2x / week   PT Duration 8 weeks   PT Treatment/Interventions ADLs/Self Care Home Management;Aquatic Therapy;Biofeedback;Cryotherapy;Moist Heat;DME Instruction;Balance training;Therapeutic exercise;Therapeutic activities;Functional mobility training;Stair training;Gait training;Neuromuscular re-education;Cognitive remediation;Patient/family education;Manual techniques;Energy conservation;Visual/perceptual remediation/compensation   PT Next Visit Plan VORx1, gait with dual task, progress towards potentially dancing.    PT Home Exercise Plan Sitting t-band, will advance with balance and standing resistance    Consulted and Agree with Plan of Care Patient      Patient will benefit from skilled therapeutic intervention in order to improve the following deficits and impairments:  Abnormal gait, Decreased activity tolerance, Decreased balance, Decreased mobility, Decreased knowledge of use of DME, Decreased strength, Difficulty walking, Dizziness, Impaired perceived functional ability, Improper body mechanics, Postural dysfunction  Visit Diagnosis: Unsteadiness on feet  Muscle weakness (generalized)     Problem List Patient Active Problem List   Diagnosis Date Noted  . Exhaustion 04/20/2017  . Overweight 04/20/2017  . Anxiety 10/31/2015  . Borderline diabetes  mellitus 10/31/2015  . Recurrent major depression in remission ([place]) 10/31/2015  . Blood glucose elevated 08/06/2015  . Benign essential tremor 04/18/2015  . Borderline diabetes 04/18/2015  . Clinical depression 04/18/2015  . Cerebrovascular accident, old 04/18/2015  . Adult hypothyroidism 04/18/2015  . Obstructive apnea 04/18/2015  . Panic attack 04/18/2015  . Gastroduodenal ulcer 04/18/2015  . Dizziness 03/18/2015  . Abnormal vision as late effect of cerebrovascular disease 02/26/2015  . Anxiety and depression 02/15/2015  . BPH with obstruction/lower urinary tract symptoms 02/15/2015  . Acid reflux 02/15/2015  . Incomplete bladder emptying 02/15/2015  . CA of skin 02/15/2015  . Apnea, sleep 02/15/2015  . Gastric ulcer 02/15/2015  . Temporary cerebral vascular dysfunction 02/15/2015  . Chronic kidney disease (CKD), stage III (moderate) 09/25/2014  . Benign hypertension 02/20/2014  . 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 CAD in native artery 02/20/2014  . Hypercholesterolemia without hypertriglyceridemia 02/20/2014  . Pure hypercholesterolemia 02/20/2014  Janna Arch, PT, DPT    Janna Arch 08/03/2017, 11:56 AM  [place] MAIN [facility] SERVICES [address] [place], [place], [address] Phone: [phone]   Fax:  [phone]  Name: Cody Blackburn MRN: [id number] Date of Birth: 1923-11-07

## 2017-08-08 ENCOUNTER — Encounter: Payer: Self-pay | Admitting: Physical Therapy

## 2017-08-08 ENCOUNTER — Ambulatory Visit: Payer: Medicare Other | Admitting: Physical Therapy

## 2017-08-08 DIAGNOSIS — R2681 Unsteadiness on feet: Secondary | ICD-10-CM

## 2017-08-08 DIAGNOSIS — M6281 Muscle weakness (generalized): Secondary | ICD-10-CM

## 2017-08-08 NOTE — Therapy (Signed)
[place] MAIN [facility] SERVICES [address] [place], [place], [address] Phone: [phone]   Fax:  [phone]  Physical Therapy Treatment  Patient Details  Name: Cody Blackburn MRN: [id number] Date of Birth: December 31, 1922 Referring Provider: Dr. Caryl Bis  Encounter Date: 08/08/2017      PT End of Session - 08/08/17 1440    Visit Number 4   Number of Visits 17   Date for PT Re-Evaluation October 10, 2017   Authorization Type G-codes    Authorization Time Period 4   Authorization - Visit Number 10   PT Start Time 0143   PT Stop Time 0230   PT Time Calculation (min) 47 min   Equipment Utilized During Treatment Gait belt   Activity Tolerance Patient tolerated treatment well;No increased pain   Behavior During Therapy [facility] for tasks assessed/performed;Anxious      Past Medical History:  Diagnosis Date  . Anxiety and depression   . BPH (benign prostatic hyperplasia)   . GERD (gastroesophageal reflux disease)   . Hypertension   . Incomplete bladder emptying   . Skin cancer    In remission for several years; bianual dermatologist apponitments  . Sleep apnea   . Stomach ulcer   . TIA (transient ischemic attack)     Past Surgical History:  Procedure Laterality Date  . CARDIAC SURGERY  2005  . CARDIAC SURGERY  1977    There were no vitals filed for this visit.      Subjective Assessment - 08/08/17 1439    Subjective Pt states that overall he is feeling well today.  He states that he continues to feel that his balance is not where it should be and expresses that he has a fear of falling.     Pertinent History 83 y/o male presents with a referral for balance deficits. Pt started doing cardiorespiratory exercise about a year or two ago, but was feeling increased fatigue and switched to yoga. About 3 weeks ago he started cardio exercise at the gym again, and again had severe fatigue. He has had a feelings lightheadedness and dizziness over the  last few weeks. Pt ambulates with [name] for community ambulation, but uses no AD in his home. He reports difficulty and fear with walking over steps, such as the 2 steps to access his kitchen. Pt had recent ED visit on 8/24 due to personal concerns over his elevated BP with systolic in the 510C. The ED physician note reported that pt's BP was "a little bit elevated over baseline." Pt checks his BP regularly at home. Pt had a recent visit with his cardiologist on 05/16/17; cardiologist reports that pt's BP is "fairly well controlled".  Pt had a stroke in 2014 with residual balance deficits. Medical Hx is significant for stage III CKD, skin cancer (in remission), and anxiety/depression.    Limitations Walking;House hold activities   How long can you sit comfortably? No limitations    How long can you stand comfortably? Long enough to do yoga, 10-20 min   How long can you walk comfortably? several hunderd feet; pt is limited by leg pain.   Patient Stated Goals To improve balance    Currently in Pain? No/denies   Multiple Pain Sites No      Treatment: Toe taps to 6" step x 20 B LE's   Toe taps to 6" step from foam pad x 20 B LE's - cues to slow steps and focus on toe clearance   Lateral  steps from foam pads over 1/2 foam x 20 reps each way - cues for form, CGA for safety   Diagnoal card taps while standing on foam pad - 20 reps each way; High reach to low reach   Step overs with orange obstacles - 10 laps in // bars  4 way card read standing on 1/2 foam - 20 reps x 2; therapist calling either up, down, L or R   1/2 foam static balance - 3 x 30 seconds  Walking in hallway reading cards - 2 laps with CGA for safety  Walking in hallway with up/down random head turns - 2 laps with CGA for safety   Standing cone taps for balance and coordination 15 reps x 2 on each LE - therapist calling cone color for directional taps in random sequence in order to focus on weight shifting and reaction with  single leg balance  Foam beam lateral walking x 5 laps in // bars without UE support   Static stance narrow base balance practice with eyes closed on foam pad - 3 x 30 seconds; able to self correct when swaying outside BOS  Standing on foam pad with UE chest press x 20 using 1.5#rod  Standing on foam pad with UE flexion x 20 using 1.5#Rod   Ball toss x 40 standing in // bars with ball thrown inside and outside BOS                              PT Education - 08/08/17 1438    Education provided Yes   Education Details Balance progressions, continuation of balance practice and yoga   Person(s) Educated Patient   Methods Explanation   Comprehension Verbalized understanding          PT Short Term Goals - 07/27/17 1041      PT SHORT TERM GOAL #1   Title Pt will demonstrate proper sequencing with use of AD with 2 point gait pattern to improve safety and stability in gait.   Baseline Uses AD inconsistently without proprer sequencing    Time 4   Period Weeks   Status New   Target Date 08/24/17     PT SHORT TERM GOAL #2   Title Pt will perform HEP at least 3/7 days/wk in order to show meaningful improvement in strength and balance for improved functional mobility and safety.   Time 4   Period Weeks   Status New   Target Date 08/24/17           PT Long Term Goals - 07/27/17 1036      PT LONG TERM GOAL #1   Title Patient will be independent in home exercise program to improve strength/mobility for better functional independence with ADLs.   Time 8   Period Weeks   Status New   Target Date 09/21/17     PT LONG TERM GOAL #2   Title Patient will increase ABC scale score >80% to demonstrate better functional mobility and better confidence with ADLs.    Baseline 40% functioning indicating low level of physical functioning   Time 8   Period Weeks   Status New   Target Date 09/21/17     PT LONG TERM GOAL #3   Title Patient will increase Functional  Gait Assessment score to >20/30 as to reduce fall risk and improve dynamic gait safety with community ambulation.   Baseline 12/30 indicating high fall risk  Time 8   Period Weeks   Status New   Target Date 09/21/17     PT LONG TERM GOAL #4   Title Patient will reduce dizziness handicap inventory score to <50, for less dizziness with ADLs and increased safety with home and work tasks.    Baseline score = 80 indicating severe disability   Time 8   Period Weeks   Status New   Target Date 09/21/17     PT LONG TERM GOAL #5   Title Patient will increase BLE gross strength to 4+/5 as to improve functional strength for independent gait, increased standing tolerance and increased ADL ability.   Baseline Grossly 4 to 4+/5   Time 8   Period Weeks   Status New   Target Date 09/21/17               Plan - 08/08/17 1441    Clinical Impression Statement Pt did well with all interventions today.  He was able to progress through static and dynamic balance activities today without fatigue, but continues to express fear of falling.  Pt has mild LOB when trying to perform exercises and carry on a conversation, however he demonstrated ability to self correct and return with control when deviating outside his BOS. He was able to perform dynamic and static activities today with head turns and changes of direction without increased expressing increased dizziness and without LOB.  Pt would continue to benefit from skilled therapy services in order to address balance deficits and improve overall saftey with mobility.   Rehab Potential Good   Clinical Impairments Affecting Rehab Potential Anxiety and depression   PT Frequency 2x / week   PT Duration 8 weeks   PT Treatment/Interventions ADLs/Self Care Home Management;Aquatic Therapy;Biofeedback;Cryotherapy;Moist Heat;DME Instruction;Balance training;Therapeutic exercise;Therapeutic activities;Functional mobility training;Stair training;Gait  training;Neuromuscular re-education;Cognitive remediation;Patient/family education;Manual techniques;Energy conservation;Visual/perceptual remediation/compensation   PT Next Visit Plan VORx1, gait with dual task, progress towards potentially dancing.    PT Home Exercise Plan Sitting t-band, will advance with balance and standing resistance    Consulted and Agree with Plan of Care Patient      Patient will benefit from skilled therapeutic intervention in order to improve the following deficits and impairments:  Abnormal gait, Decreased activity tolerance, Decreased balance, Decreased mobility, Decreased knowledge of use of DME, Decreased strength, Difficulty walking, Dizziness, Impaired perceived functional ability, Improper body mechanics, Postural dysfunction  Visit Diagnosis: Unsteadiness on feet  Muscle weakness (generalized)     Problem List Patient Active Problem List   Diagnosis Date Noted  . Exhaustion 04/20/2017  . Overweight 04/20/2017  . Anxiety 10/31/2015  . Borderline diabetes mellitus 10/31/2015  . Recurrent major depression in remission ([place]) 10/31/2015  . Blood glucose elevated 08/06/2015  . Benign essential tremor 04/18/2015  . Borderline diabetes 04/18/2015  . Clinical depression 04/18/2015  . Cerebrovascular accident, old 04/18/2015  . Adult hypothyroidism 04/18/2015  . Obstructive apnea 04/18/2015  . Panic attack 04/18/2015  . Gastroduodenal ulcer 04/18/2015  . Dizziness 03/18/2015  . Abnormal vision as late effect of cerebrovascular disease 02/26/2015  . Anxiety and depression 02/15/2015  . BPH with obstruction/lower urinary tract symptoms 02/15/2015  . Acid reflux 02/15/2015  . Incomplete bladder emptying 02/15/2015  . CA of skin 02/15/2015  . Apnea, sleep 02/15/2015  . Gastric ulcer 02/15/2015  . Temporary cerebral vascular dysfunction 02/15/2015  . Chronic kidney disease (CKD), stage III (moderate) 09/25/2014  . Benign hypertension 02/20/2014  .  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 CAD in native artery 02/20/2014  . Hypercholesterolemia without hypertriglyceridemia 02/20/2014  . Pure hypercholesterolemia 02/20/2014   This entire session was performed under direct supervision and direction of a licensed therapist/therapist assistant . I have personally read, edited and approve of the note as written. Netta Corrigan, SPT Alanson Puls, PT, DPT  08/08/2017, 2:46 PM  [place] MAIN [facility] SERVICES [address] [place], [place], [address] Phone: [phone]   Fax:  [phone]  Name: Cody Blackburn MRN: [id number] Date of Birth: May 11, 1923

## 2017-08-10 ENCOUNTER — Ambulatory Visit: Payer: Medicare Other

## 2017-08-10 DIAGNOSIS — M6281 Muscle weakness (generalized): Secondary | ICD-10-CM

## 2017-08-10 DIAGNOSIS — R2681 Unsteadiness on feet: Secondary | ICD-10-CM | POA: Diagnosis not present

## 2017-08-10 NOTE — Therapy (Signed)
[place] MAIN [facility] SERVICES [address] [place], [place], [address] Phone: [phone]   Fax:  [phone]  Physical Therapy Treatment  Patient Details  Name: Cody Blackburn MRN: [id number] Date of Birth: [birth year] Referring Provider: Dr. Caryl Bis  Encounter Date: 08/10/2017      PT End of Session - 08/10/17 1231    Visit Number 5   Number of Visits 17   Date for PT Re-Evaluation 10/21/17   Authorization Type G-codes    Authorization Time Period 5   Authorization - Visit Number 10   PT Start Time 0938   PT Stop Time 1115   PT Time Calculation (min) 44 min   Equipment Utilized During Treatment Gait belt   Activity Tolerance Patient tolerated treatment well;No increased pain   Behavior During Therapy [facility] for tasks assessed/performed;Anxious      Past Medical History:  Diagnosis Date  . Anxiety and depression   . BPH (benign prostatic hyperplasia)   . GERD (gastroesophageal reflux disease)   . Hypertension   . Incomplete bladder emptying   . Skin cancer    In remission for several years; bianual dermatologist apponitments  . Sleep apnea   . Stomach ulcer   . TIA (transient ischemic attack)     Past Surgical History:  Procedure Laterality Date  . CARDIAC SURGERY  2005  . CARDIAC SURGERY  1977    There were no vitals filed for this visit.      Subjective Assessment - 08/10/17 1230    Subjective Patient resports that he has not had a fall but had some stumbles since last session. Is feeling good today, that his balance is much better today than previously.    Pertinent History 83 y/o male presents with a referral for balance deficits. Pt started doing cardiorespiratory exercise about a year or two ago, but was feeling increased fatigue and switched to yoga. About 3 weeks ago he started cardio exercise at the gym again, and again had severe fatigue. He has had a feelings lightheadedness and dizziness over the last few  weeks. Pt ambulates with [name] for community ambulation, but uses no AD in his home. He reports difficulty and fear with walking over steps, such as the 2 steps to access his kitchen. Pt had recent ED visit on 8/24 due to personal concerns over his elevated BP with systolic in the 182X. The ED physician note reported that pt's BP was "a little bit elevated over baseline." Pt checks his BP regularly at home. Pt had a recent visit with his cardiologist on 05/16/17; cardiologist reports that pt's BP is "fairly well controlled".  Pt had a stroke in 2014 with residual balance deficits. Medical Hx is significant for stage III CKD, skin cancer (in remission), and anxiety/depression.    Limitations Walking;House hold activities   How long can you sit comfortably? No limitations    How long can you stand comfortably? Long enough to do yoga, 10-20 min   How long can you walk comfortably? several hunderd feet; pt is limited by leg pain.   Patient Stated Goals To improve balance    Currently in Pain? No/denies      Treatment: Walking around 6 cones without AD, 4x, no LOB, knocked over 1 cone. , CGA   Figure 8 around cones without AD 8x. No LOB, CGA  Toe taps to 6" step x 20 B LE's  No UE support   Neuro RE-ed Airex pad: horizontal and  vertical head turns x 20 VOR x 1 no increased dizziness  Ball pass no UE support, no LOB  Step forward and backwards over orange hurdle 15x each leg, occasional LOB Step sideways over orange hurdle, occasional LOB, and require UE support 10x each  One leg on green soft pad with UE raises 10x each side, performed on both legs One leg on basketball, UE raises 10x each arm, maintained good balance no LOB, performed on both legs One leg on basketball, horizontal head turns, performed on both legs Standing with eyes closed on airex pad, wobbly require CGA by PT to maintain upright postiion Eyes closed on stable ground, frequent trunk sway                                 PT Education - 08/10/17 1230    Education provided Yes   Education Details balance progression   Person(s) Educated Patient   Methods Explanation;Demonstration   Comprehension Verbalized understanding;Returned demonstration          PT Short Term Goals - 07/27/17 1041      PT SHORT TERM GOAL #1   Title Pt will demonstrate proper sequencing with use of AD with 2 point gait pattern to improve safety and stability in gait.   Baseline Uses AD inconsistently without proprer sequencing    Time 4   Period Weeks   Status New   Target Date 08/24/17     PT SHORT TERM GOAL #2   Title Pt will perform HEP at least 3/7 days/wk in order to show meaningful improvement in strength and balance for improved functional mobility and safety.   Time 4   Period Weeks   Status New   Target Date 08/24/17           PT Long Term Goals - 07/27/17 1036      PT LONG TERM GOAL #1   Title Patient will be independent in home exercise program to improve strength/mobility for better functional independence with ADLs.   Time 8   Period Weeks   Status New   Target Date 09/21/17     PT LONG TERM GOAL #2   Title Patient will increase ABC scale score >80% to demonstrate better functional mobility and better confidence with ADLs.    Baseline 40% functioning indicating low level of physical functioning   Time 8   Period Weeks   Status New   Target Date 09/21/17     PT LONG TERM GOAL #3   Title Patient will increase Functional Gait Assessment score to >20/30 as to reduce fall risk and improve dynamic gait safety with community ambulation.   Baseline 12/30 indicating high fall risk   Time 8   Period Weeks   Status New   Target Date 09/21/17     PT LONG TERM GOAL #4   Title Patient will reduce dizziness handicap inventory score to <50, for less dizziness with ADLs and increased safety with home and work tasks.    Baseline score = 80 indicating severe  disability   Time 8   Period Weeks   Status New   Target Date 09/21/17     PT LONG TERM GOAL #5   Title Patient will increase BLE gross strength to 4+/5 as to improve functional strength for independent gait, increased standing tolerance and increased ADL ability.   Baseline Grossly 4 to 4+/5   Time 8  Period Weeks   Status New   Target Date 09/21/17               Plan - 08/10/17 1233    Clinical Impression Statement Patient demonstrated stable ambulation without use of AD. Modified SLS with ball utilized to progress patient towards more safe dynamic motions requiring single limb support. Head turns and eyes closed are most challenging to patient at this time. Patient will continue to benefit from skilled physical therapy services to address balance deficits and improve overall safety with mobility.   Rehab Potential Good   Clinical Impairments Affecting Rehab Potential Anxiety and depression   PT Frequency 2x / week   PT Duration 8 weeks   PT Treatment/Interventions ADLs/Self Care Home Management;Aquatic Therapy;Biofeedback;Cryotherapy;Moist Heat;DME Instruction;Balance training;Therapeutic exercise;Therapeutic activities;Functional mobility training;Stair training;Gait training;Neuromuscular re-education;Cognitive remediation;Patient/family education;Manual techniques;Energy conservation;Visual/perceptual remediation/compensation   PT Next Visit Plan VORx1, gait with dual task, progress towards potentially dancing.    PT Home Exercise Plan Sitting t-band, will advance with balance and standing resistance    Consulted and Agree with Plan of Care Patient      Patient will benefit from skilled therapeutic intervention in order to improve the following deficits and impairments:  Abnormal gait, Decreased activity tolerance, Decreased balance, Decreased mobility, Decreased knowledge of use of DME, Decreased strength, Difficulty walking, Dizziness, Impaired perceived functional  ability, Improper body mechanics, Postural dysfunction  Visit Diagnosis: Unsteadiness on feet  Muscle weakness (generalized)     Problem List Patient Active Problem List   Diagnosis Date Noted  . Exhaustion 04/20/2017  . Overweight 04/20/2017  . Anxiety 10/31/2015  . Borderline diabetes mellitus 10/31/2015  . Recurrent major depression in remission ([place]) 10/31/2015  . Blood glucose elevated 08/06/2015  . Benign essential tremor 04/18/2015  . Borderline diabetes 04/18/2015  . Clinical depression 04/18/2015  . Cerebrovascular accident, old 04/18/2015  . Adult hypothyroidism 04/18/2015  . Obstructive apnea 04/18/2015  . Panic attack 04/18/2015  . Gastroduodenal ulcer 04/18/2015  . Dizziness 03/18/2015  . Abnormal vision as late effect of cerebrovascular disease 02/26/2015  . Anxiety and depression 02/15/2015  . BPH with obstruction/lower urinary tract symptoms 02/15/2015  . Acid reflux 02/15/2015  . Incomplete bladder emptying 02/15/2015  . CA of skin 02/15/2015  . Apnea, sleep 02/15/2015  . Gastric ulcer 02/15/2015  . Temporary cerebral vascular dysfunction 02/15/2015  . Chronic kidney disease (CKD), stage III (moderate) 09/25/2014  . Benign hypertension 02/20/2014  . 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 CAD in native artery 02/20/2014  . Hypercholesterolemia without hypertriglyceridemia 02/20/2014  . Pure hypercholesterolemia 02/20/2014   Janna Arch, PT, DPT   Janna Arch 08/10/2017, 12:33 PM  [place] MAIN [facility] SERVICES [address] [place], [place], [address] Phone: [phone]   Fax:  [phone]  Name: Cody Blackburn MRN: [id number] Date of Birth: [birth year]

## 2017-08-15 ENCOUNTER — Ambulatory Visit: Payer: Medicare Other | Attending: Family Medicine

## 2017-08-15 DIAGNOSIS — R2681 Unsteadiness on feet: Secondary | ICD-10-CM | POA: Insufficient documentation

## 2017-08-15 DIAGNOSIS — M6281 Muscle weakness (generalized): Secondary | ICD-10-CM | POA: Insufficient documentation

## 2017-08-15 NOTE — Therapy (Signed)
[place] MAIN [facility] SERVICES [address] [place], [place], [address] Phone: [phone]   Fax:  [phone]  Physical Therapy Treatment  Patient Details  Name: Cody Blackburn MRN: [id number] Date of Birth: Jan 20, 1923 Referring Provider: Dr. Caryl Bis  Encounter Date: 08/15/2017      PT End of Session - 08/15/17 1006    Visit Number 6   Number of Visits 17   Date for PT Re-Evaluation Sep 29, 2017   Authorization Type G-codes    Authorization Time Period 6   Authorization - Visit Number 10   PT Start Time 0945   PT Stop Time 1030   PT Time Calculation (min) 45 min   Equipment Utilized During Treatment Gait belt   Activity Tolerance Patient tolerated treatment well;No increased pain   Behavior During Therapy [facility] for tasks assessed/performed;Anxious      Past Medical History:  Diagnosis Date  . Anxiety and depression   . BPH (benign prostatic hyperplasia)   . GERD (gastroesophageal reflux disease)   . Hypertension   . Incomplete bladder emptying   . Skin cancer    In remission for several years; bianual dermatologist apponitments  . Sleep apnea   . Stomach ulcer   . TIA (transient ischemic attack)     Past Surgical History:  Procedure Laterality Date  . CARDIAC SURGERY  2005  . CARDIAC SURGERY  1977    There were no vitals filed for this visit.      Subjective Assessment - 08/15/17 0949    Subjective Patient is sleepy this morning as he is not a morning person and has given up coffee. Feeling a little unsteady this morning but reports no falls over the weekend.    Pertinent History 83 y/o male presents with a referral for balance deficits. Pt started doing cardiorespiratory exercise about a year or two ago, but was feeling increased fatigue and switched to yoga. About 3 weeks ago he started cardio exercise at the gym again, and again had severe fatigue. He has had a feelings lightheadedness and dizziness over the last few  weeks. Pt ambulates with [name] for community ambulation, but uses no AD in his home. He reports difficulty and fear with walking over steps, such as the 2 steps to access his kitchen. Pt had recent ED visit on 8/24 due to personal concerns over his elevated BP with systolic in the 532D. The ED physician note reported that pt's BP was "a little bit elevated over baseline." Pt checks his BP regularly at home. Pt had a recent visit with his cardiologist on 05/16/17; cardiologist reports that pt's BP is "fairly well controlled".  Pt had a stroke in 2014 with residual balance deficits. Medical Hx is significant for stage III CKD, skin cancer (in remission), and anxiety/depression.    Limitations Walking;House hold activities   How long can you sit comfortably? No limitations    How long can you stand comfortably? Long enough to do yoga, 10-20 min   How long can you walk comfortably? several hunderd feet; pt is limited by leg pain.   Patient Stated Goals To improve balance    Currently in Pain? No/denies      Neuro Re-ed: Ambulating in hallway reading cards. Patient had two incidences of stumbling to the left. Without AD  Ambulating around 6 cones stepping over obstacles without AD, increasing height of obstacles with repetitions one LOB over hurdle x6 trials Stepping forward and backwards over hurdle 10x each leg,  occasional LOB, require CGA.  Airex: eyes closed 30 seconds, occasional Forward sway , CGA, no LOB Airex: marching 20x, stopped fatigue.  Airex: semi tandem stance 2x80 seconds  TherEx Sit to stands 10x , cues for completing body motion 6 inch step up no UE support 15x each leg, excessive posterior lean Ankle df and pf with RTB 20x seated  Side stepping in // bars 4x length of bars, cues for keeping toes forward                            PT Education - 08/15/17 1006    Education provided Yes   Education Details balance progression   Person(s) Educated  Patient   Methods Explanation;Demonstration   Comprehension Verbalized understanding;Returned demonstration          PT Short Term Goals - 07/27/17 1041      PT SHORT TERM GOAL #1   Title Pt will demonstrate proper sequencing with use of AD with 2 point gait pattern to improve safety and stability in gait.   Baseline Uses AD inconsistently without proprer sequencing    Time 4   Period Weeks   Status New   Target Date 08/24/17     PT SHORT TERM GOAL #2   Title Pt will perform HEP at least 3/7 days/wk in order to show meaningful improvement in strength and balance for improved functional mobility and safety.   Time 4   Period Weeks   Status New   Target Date 08/24/17           PT Long Term Goals - 07/27/17 1036      PT LONG TERM GOAL #1   Title Patient will be independent in home exercise program to improve strength/mobility for better functional independence with ADLs.   Time 8   Period Weeks   Status New   Target Date 09/21/17     PT LONG TERM GOAL #2   Title Patient will increase ABC scale score >80% to demonstrate better functional mobility and better confidence with ADLs.    Baseline 40% functioning indicating low level of physical functioning   Time 8   Period Weeks   Status New   Target Date 09/21/17     PT LONG TERM GOAL #3   Title Patient will increase Functional Gait Assessment score to >20/30 as to reduce fall risk and improve dynamic gait safety with community ambulation.   Baseline 12/30 indicating high fall risk   Time 8   Period Weeks   Status New   Target Date 09/21/17     PT LONG TERM GOAL #4   Title Patient will reduce dizziness handicap inventory score to <50, for less dizziness with ADLs and increased safety with home and work tasks.    Baseline score = 80 indicating severe disability   Time 8   Period Weeks   Status New   Target Date 09/21/17     PT LONG TERM GOAL #5   Title Patient will increase BLE gross strength to 4+/5 as to  improve functional strength for independent gait, increased standing tolerance and increased ADL ability.   Baseline Grossly 4 to 4+/5   Time 8   Period Weeks   Status New   Target Date 09/21/17               Plan - 08/15/17 1018    Clinical Impression Statement Patient demonstrates posterior and leftward LOB with  dynamic motion. Occasional seated rest breaks required for pt. fatigue. Cardiovascular endurance limits patient's ability to ambulate and perform prolonged standing tasks. Patient will continue to benefit from skilled physical therapy services to address balance deficits and improve overall safety with mobility.    Rehab Potential Good   Clinical Impairments Affecting Rehab Potential Anxiety and depression   PT Frequency 2x / week   PT Duration 8 weeks   PT Treatment/Interventions ADLs/Self Care Home Management;Aquatic Therapy;Biofeedback;Cryotherapy;Moist Heat;DME Instruction;Balance training;Therapeutic exercise;Therapeutic activities;Functional mobility training;Stair training;Gait training;Neuromuscular re-education;Cognitive remediation;Patient/family education;Manual techniques;Energy conservation;Visual/perceptual remediation/compensation   PT Next Visit Plan VORx1, gait with dual task, progress towards potentially dancing.    PT Home Exercise Plan Sitting t-band, will advance with balance and standing resistance    Consulted and Agree with Plan of Care Patient      Patient will benefit from skilled therapeutic intervention in order to improve the following deficits and impairments:  Abnormal gait, Decreased activity tolerance, Decreased balance, Decreased mobility, Decreased knowledge of use of DME, Decreased strength, Difficulty walking, Dizziness, Impaired perceived functional ability, Improper body mechanics, Postural dysfunction  Visit Diagnosis: Unsteadiness on feet  Muscle weakness (generalized)     Problem List Patient Active Problem List   Diagnosis  Date Noted  . Exhaustion 04/20/2017  . Overweight 04/20/2017  . Anxiety 10/31/2015  . Borderline diabetes mellitus 10/31/2015  . Recurrent major depression in remission ([place]) 10/31/2015  . Blood glucose elevated 08/06/2015  . Benign essential tremor 04/18/2015  . Borderline diabetes 04/18/2015  . Clinical depression 04/18/2015  . Cerebrovascular accident, old 04/18/2015  . Adult hypothyroidism 04/18/2015  . Obstructive apnea 04/18/2015  . Panic attack 04/18/2015  . Gastroduodenal ulcer 04/18/2015  . Dizziness 03/18/2015  . Abnormal vision as late effect of cerebrovascular disease 02/26/2015  . Anxiety and depression 02/15/2015  . BPH with obstruction/lower urinary tract symptoms 02/15/2015  . Acid reflux 02/15/2015  . Incomplete bladder emptying 02/15/2015  . CA of skin 02/15/2015  . Apnea, sleep 02/15/2015  . Gastric ulcer 02/15/2015  . Temporary cerebral vascular dysfunction 02/15/2015  . Chronic kidney disease (CKD), stage III (moderate) ([place]) 09/25/2014  . Benign hypertension 02/20/2014  . 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 CAD in native artery 02/20/2014  . Hypercholesterolemia without hypertriglyceridemia 02/20/2014  . Pure hypercholesterolemia 02/20/2014   Janna Arch, PT, DPT   Janna Arch 08/15/2017, 10:31 AM  [place] MAIN [facility] SERVICES [address] [place], [place], [address] Phone: [phone]   Fax:  [phone]  Name: Cody Blackburn MRN: [id number] Date of Birth: 01-02-1923

## 2017-08-17 ENCOUNTER — Ambulatory Visit: Payer: Medicare Other

## 2017-08-17 DIAGNOSIS — R2681 Unsteadiness on feet: Secondary | ICD-10-CM | POA: Diagnosis not present

## 2017-08-17 DIAGNOSIS — M6281 Muscle weakness (generalized): Secondary | ICD-10-CM

## 2017-08-17 NOTE — Therapy (Signed)
[place] MAIN [facility] SERVICES [address] [place], [place], [address] Phone: [phone]   Fax:  [phone]  Physical Therapy Treatment  Patient Details  Name: Cody Blackburn MRN: [id number] Date of Birth: 07-05-1923 Referring Provider: Dr. Caryl Bis  Encounter Date: 08/17/2017      PT End of Session - 08/17/17 0949    Visit Number 7   Number of Visits 17   Date for PT Re-Evaluation 10/12/2017   Authorization Type G-codes    Authorization Time Period 7   Authorization - Visit Number 10   PT Start Time 0945   PT Stop Time 1030   PT Time Calculation (min) 45 min   Equipment Utilized During Treatment Gait belt   Activity Tolerance Patient tolerated treatment well;No increased pain   Behavior During Therapy [facility] for tasks assessed/performed;Anxious      Past Medical History:  Diagnosis Date  . Anxiety and depression   . BPH (benign prostatic hyperplasia)   . GERD (gastroesophageal reflux disease)   . Hypertension   . Incomplete bladder emptying   . Skin cancer    In remission for several years; bianual dermatologist apponitments  . Sleep apnea   . Stomach ulcer   . TIA (transient ischemic attack)     Past Surgical History:  Procedure Laterality Date  . CARDIAC SURGERY  2005  . CARDIAC SURGERY  1977    There were no vitals filed for this visit.      Subjective Assessment - 08/17/17 0948    Subjective Patient turned 83 yesterday. Broke a tooth today. Not feeling the best, had a bad day yesterday.    Pertinent History 83 y/o male presents with a referral for balance deficits. Pt started doing cardiorespiratory exercise about a year or two ago, but was feeling increased fatigue and switched to yoga. About 3 weeks ago he started cardio exercise at the gym again, and again had severe fatigue. He has had a feelings lightheadedness and dizziness over the last few weeks. Pt ambulates with [name] for community ambulation, but uses  no AD in his home. He reports difficulty and fear with walking over steps, such as the 2 steps to access his kitchen. Pt had recent ED visit on 8/24 due to personal concerns over his elevated BP with systolic in the 250N. The ED physician note reported that pt's BP was "a little bit elevated over baseline." Pt checks his BP regularly at home. Pt had a recent visit with his cardiologist on 05/16/17; cardiologist reports that pt's BP is "fairly well controlled".  Pt had a stroke in 2014 with residual balance deficits. Medical Hx is significant for stage III CKD, skin cancer (in remission), and anxiety/depression.    Limitations Walking;House hold activities   How long can you sit comfortably? No limitations    How long can you stand comfortably? Long enough to do yoga, 10-20 min   How long can you walk comfortably? several hunderd feet; pt is limited by leg pain.   Patient Stated Goals To improve balance    Currently in Pain? No/denies       NuStep Lvl 4 3 minutes  Neuro Re-ed Agility ladder   Side stepping 4x length, cues for upright posture, occasional stepping on white line   Forward one foot in each square stepping, occasional LOB when on L SLS. 4x  Airex pad weighted ball flexion10x, wood chop 10x each side  Airex pad: eyes closed 90 seconds: posterior LOB/sway;  horizontal head turns 20x, vertical head turns 20x with posterior trunk sway  Airex pad step over orange hurdle onto other airex pad and back 10x each leg , more difficulty when maintaining SLS on LLE.  Forward lunges on Bosu ball 15x each leg with SUE support, 10x each leg with no UE support Side step lunges onto bosu ball x15 each leg with UE support, 10x each leg with no UE support   TherEx Sit to stands from chair Abduction with RTB 12x each leg  6" step toe taps 20x no UE support, occasional LOB High knee marching in // bars 8x with BUE support, buttkicker walking 4x in // bars with BUE support  Pt. response to medical  necessity:  Patient will continue to benefit from skilled physical therapy services to address balance deficits and improve overall safety with mobility .                      PT Education - 08/17/17 0949    Education provided Yes   Education Details balance progression   Person(s) Educated Patient   Methods Explanation;Demonstration;Verbal cues   Comprehension Verbalized understanding;Returned demonstration          PT Short Term Goals - 07/27/17 1041      PT SHORT TERM GOAL #1   Title Pt will demonstrate proper sequencing with use of AD with 2 point gait pattern to improve safety and stability in gait.   Baseline Uses AD inconsistently without proprer sequencing    Time 4   Period Weeks   Status New   Target Date 08/24/17     PT SHORT TERM GOAL #2   Title Pt will perform HEP at least 3/7 days/wk in order to show meaningful improvement in strength and balance for improved functional mobility and safety.   Time 4   Period Weeks   Status New   Target Date 08/24/17           PT Long Term Goals - 07/27/17 1036      PT LONG TERM GOAL #1   Title Patient will be independent in home exercise program to improve strength/mobility for better functional independence with ADLs.   Time 8   Period Weeks   Status New   Target Date 09/21/17     PT LONG TERM GOAL #2   Title Patient will increase ABC scale score >80% to demonstrate better functional mobility and better confidence with ADLs.    Baseline 40% functioning indicating low level of physical functioning   Time 8   Period Weeks   Status New   Target Date 09/21/17     PT LONG TERM GOAL #3   Title Patient will increase Functional Gait Assessment score to >20/30 as to reduce fall risk and improve dynamic gait safety with community ambulation.   Baseline 12/30 indicating high fall risk   Time 8   Period Weeks   Status New   Target Date 09/21/17     PT LONG TERM GOAL #4   Title Patient will reduce  dizziness handicap inventory score to <50, for less dizziness with ADLs and increased safety with home and work tasks.    Baseline score = 80 indicating severe disability   Time 8   Period Weeks   Status New   Target Date 09/21/17     PT LONG TERM GOAL #5   Title Patient will increase BLE gross strength to 4+/5 as to improve functional strength for  independent gait, increased standing tolerance and increased ADL ability.   Baseline Grossly 4 to 4+/5   Time 8   Period Weeks   Status New   Target Date 09/21/17               Plan - 08/17/17 1025    Clinical Impression Statement Patient challenged by tasks requiring SLS on LLE. Dynamic motion on unstable surfaces is improving with patient demonstrating decreased episodes of LOB. Patient will continue to benefit from skilled physical therapy services to address balance deficits and improve overall safety with mobility .   Rehab Potential Good   Clinical Impairments Affecting Rehab Potential Anxiety and depression   PT Frequency 2x / week   PT Duration 8 weeks   PT Treatment/Interventions ADLs/Self Care Home Management;Aquatic Therapy;Biofeedback;Cryotherapy;Moist Heat;DME Instruction;Balance training;Therapeutic exercise;Therapeutic activities;Functional mobility training;Stair training;Gait training;Neuromuscular re-education;Cognitive remediation;Patient/family education;Manual techniques;Energy conservation;Visual/perceptual remediation/compensation   PT Next Visit Plan VORx1, gait with dual task, progress towards potentially dancing.    PT Home Exercise Plan Sitting t-band, will advance with balance and standing resistance    Consulted and Agree with Plan of Care Patient      Patient will benefit from skilled therapeutic intervention in order to improve the following deficits and impairments:  Abnormal gait, Decreased activity tolerance, Decreased balance, Decreased mobility, Decreased knowledge of use of DME, Decreased strength,  Difficulty walking, Dizziness, Impaired perceived functional ability, Improper body mechanics, Postural dysfunction  Visit Diagnosis: Unsteadiness on feet  Muscle weakness (generalized)     Problem List Patient Active Problem List   Diagnosis Date Noted  . Exhaustion 04/20/2017  . Overweight 04/20/2017  . Anxiety 10/31/2015  . Borderline diabetes mellitus 10/31/2015  . Recurrent major depression in remission ([place]) 10/31/2015  . Blood glucose elevated 08/06/2015  . Benign essential tremor 04/18/2015  . Borderline diabetes 04/18/2015  . Clinical depression 04/18/2015  . Cerebrovascular accident, old 04/18/2015  . Adult hypothyroidism 04/18/2015  . Obstructive apnea 04/18/2015  . Panic attack 04/18/2015  . Gastroduodenal ulcer 04/18/2015  . Dizziness 03/18/2015  . Abnormal vision as late effect of cerebrovascular disease 02/26/2015  . Anxiety and depression 02/15/2015  . BPH with obstruction/lower urinary tract symptoms 02/15/2015  . Acid reflux 02/15/2015  . Incomplete bladder emptying 02/15/2015  . CA of skin 02/15/2015  . Apnea, sleep 02/15/2015  . Gastric ulcer 02/15/2015  . Temporary cerebral vascular dysfunction 02/15/2015  . Chronic kidney disease (CKD), stage III (moderate) ([place]) 09/25/2014  . Benign hypertension 02/20/2014  . 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 CAD in native artery 02/20/2014  . Hypercholesterolemia without hypertriglyceridemia 02/20/2014  . Pure hypercholesterolemia 02/20/2014   Janna Arch, PT, DPT   Janna Arch 08/17/2017, 10:25 AM  [place] MAIN [facility] SERVICES [address] [place], [place], [address] Phone: [phone]   Fax:  [phone]  Name: Cody Blackburn MRN: [id number] Date of Birth: 09/25/1923

## 2017-08-20 ENCOUNTER — Other Ambulatory Visit: Payer: Self-pay | Admitting: Family Medicine

## 2017-08-22 ENCOUNTER — Ambulatory Visit: Payer: Medicare Other

## 2017-08-22 DIAGNOSIS — R2681 Unsteadiness on feet: Secondary | ICD-10-CM | POA: Diagnosis not present

## 2017-08-22 DIAGNOSIS — M6281 Muscle weakness (generalized): Secondary | ICD-10-CM

## 2017-08-22 NOTE — Therapy (Signed)
[place] MAIN [facility] SERVICES [address] [place], [place], [address] Phone: [phone]   Fax:  [phone]  Physical Therapy Treatment  Patient Details  Name: Cody Blackburn MRN: [id number] Date of Birth: 1923-08-02 Referring Provider: Dr. Caryl Bis  Encounter Date: 08/22/2017      PT End of Session - 08/22/17 0948    Visit Number 8   Number of Visits 17   Date for PT Re-Evaluation 10/07/17   Authorization Type G-codes    Authorization Time Period 8   Authorization - Visit Number 10   PT Start Time 0945   PT Stop Time 1030   PT Time Calculation (min) 45 min   Equipment Utilized During Treatment Gait belt   Activity Tolerance Patient tolerated treatment well;No increased pain   Behavior During Therapy [facility] for tasks assessed/performed;Anxious      Past Medical History:  Diagnosis Date  . Anxiety and depression   . BPH (benign prostatic hyperplasia)   . GERD (gastroesophageal reflux disease)   . Hypertension   . Incomplete bladder emptying   . Skin cancer    In remission for several years; bianual dermatologist apponitments  . Sleep apnea   . Stomach ulcer   . TIA (transient ischemic attack)     Past Surgical History:  Procedure Laterality Date  . CARDIAC SURGERY  2005  . CARDIAC SURGERY  1977    There were no vitals filed for this visit.      Subjective Assessment - 08/22/17 0947    Subjective Patient reports no stumbles or falls since last session. Feeling better this session.    Pertinent History 83 y/o male presents with a referral for balance deficits. Pt started doing cardiorespiratory exercise about a year or two ago, but was feeling increased fatigue and switched to yoga. About 3 weeks ago he started cardio exercise at the gym again, and again had severe fatigue. He has had a feelings lightheadedness and dizziness over the last few weeks. Pt ambulates with [name] for community ambulation, but uses no AD in his  home. He reports difficulty and fear with walking over steps, such as the 2 steps to access his kitchen. Pt had recent ED visit on 8/24 due to personal concerns over his elevated BP with systolic in the 381W. The ED physician note reported that pt's BP was "a little bit elevated over baseline." Pt checks his BP regularly at home. Pt had a recent visit with his cardiologist on 05/16/17; cardiologist reports that pt's BP is "fairly well controlled".  Pt had a stroke in 2014 with residual balance deficits. Medical Hx is significant for stage III CKD, skin cancer (in remission), and anxiety/depression.    Limitations Walking;House hold activities   How long can you sit comfortably? No limitations    How long can you stand comfortably? Long enough to do yoga, 10-20 min   How long can you walk comfortably? several hunderd feet; pt is limited by leg pain.   Patient Stated Goals To improve balance    Currently in Pain? No/denies          NuStep Lvl 4 3 minutes  Neuro Re-ed  Airex pad weighted ball flexion10x, wood chop 10x each side   Airex pad: eyes closed 2x60 seconds: posterior LOB/sway; horizontal head turns 20x, vertical head turns 20x with posterior trunk sway   Airex pad step over orange hurdle onto other airex pad and back 12x each leg , more difficulty when  maintaining SLS on RLE.   Tandem stance with BUE support to attain position, then released upon obtaining position 2x60 seconds, more challenging with RLE back  Forward lunges on Bosu ball 15x each leg with SUE support, 10x each leg with no UE support  Tandem walk in // bars 8x, ocasional UE support required   Modified single limb stance with opposite LE on ball 2x 65 seconds, one episode of LE.   Modified SLS with opp LE on ball moving balls to different rungs 8x each leg, difficulty crossing arm across midline, causing more episodes of LOB   TherEx Heel raises 20x with BUE support  Single heel raises 15x each leg, unable to  perform without knee bent on RLE Resisted pf with GTB 20x  Sit to stands from chair   Pt. response to medical necessity:  Patient will continue to benefit from skilled physical therapy services to address balance deficits and improve overall safety with mobility .                        PT Education - 08/22/17 0949    Education provided Yes   Education Details body mechanics for functional balance   Person(s) Educated Patient   Methods Explanation;Demonstration;Verbal cues   Comprehension Verbalized understanding;Returned demonstration          PT Short Term Goals - 07/27/17 1041      PT SHORT TERM GOAL #1   Title Pt will demonstrate proper sequencing with use of AD with 2 point gait pattern to improve safety and stability in gait.   Baseline Uses AD inconsistently without proprer sequencing    Time 4   Period Weeks   Status New   Target Date 08/24/17     PT SHORT TERM GOAL #2   Title Pt will perform HEP at least 3/7 days/wk in order to show meaningful improvement in strength and balance for improved functional mobility and safety.   Time 4   Period Weeks   Status New   Target Date 08/24/17           PT Long Term Goals - 07/27/17 1036      PT LONG TERM GOAL #1   Title Patient will be independent in home exercise program to improve strength/mobility for better functional independence with ADLs.   Time 8   Period Weeks   Status New   Target Date 09/21/17     PT LONG TERM GOAL #2   Title Patient will increase ABC scale score >80% to demonstrate better functional mobility and better confidence with ADLs.    Baseline 40% functioning indicating low level of physical functioning   Time 8   Period Weeks   Status New   Target Date 09/21/17     PT LONG TERM GOAL #3   Title Patient will increase Functional Gait Assessment score to >20/30 as to reduce fall risk and improve dynamic gait safety with community ambulation.   Baseline 12/30 indicating  high fall risk   Time 8   Period Weeks   Status New   Target Date 09/21/17     PT LONG TERM GOAL #4   Title Patient will reduce dizziness handicap inventory score to <50, for less dizziness with ADLs and increased safety with home and work tasks.    Baseline score = 80 indicating severe disability   Time 8   Period Weeks   Status New   Target Date 09/21/17  PT LONG TERM GOAL #5   Title Patient will increase BLE gross strength to 4+/5 as to improve functional strength for independent gait, increased standing tolerance and increased ADL ability.   Baseline Grossly 4 to 4+/5   Time 8   Period Weeks   Status New   Target Date 09/21/17               Plan - 08/22/17 1027    Clinical Impression Statement Patient demonstrated decreased episodes of posterior LOB with dynamic motions and challenges to balance. Tandem stance requires use of UE to obtain position however is able to maintain upon achievement of position. RLE weakness apparent with with single leg heel raises.  Patient will continue to benefit from skilled physical therapy services to address balance deficits and improve overall safety with mobility .   Rehab Potential Good   Clinical Impairments Affecting Rehab Potential Anxiety and depression   PT Frequency 2x / week   PT Duration 8 weeks   PT Treatment/Interventions ADLs/Self Care Home Management;Aquatic Therapy;Biofeedback;Cryotherapy;Moist Heat;DME Instruction;Balance training;Therapeutic exercise;Therapeutic activities;Functional mobility training;Stair training;Gait training;Neuromuscular re-education;Cognitive remediation;Patient/family education;Manual techniques;Energy conservation;Visual/perceptual remediation/compensation   PT Next Visit Plan single limb stance and tandem stance   PT Home Exercise Plan Sitting t-band, will advance with balance and standing resistance    Consulted and Agree with Plan of Care Patient      Patient will benefit from skilled  therapeutic intervention in order to improve the following deficits and impairments:  Abnormal gait, Decreased activity tolerance, Decreased balance, Decreased mobility, Decreased knowledge of use of DME, Decreased strength, Difficulty walking, Dizziness, Impaired perceived functional ability, Improper body mechanics, Postural dysfunction  Visit Diagnosis: Unsteadiness on feet  Muscle weakness (generalized)     Problem List Patient Active Problem List   Diagnosis Date Noted  . Exhaustion 04/20/2017  . Overweight 04/20/2017  . Anxiety 10/31/2015  . Borderline diabetes mellitus 10/31/2015  . Recurrent major depression in remission ([place]) 10/31/2015  . Blood glucose elevated 08/06/2015  . Benign essential tremor 04/18/2015  . Borderline diabetes 04/18/2015  . Clinical depression 04/18/2015  . Cerebrovascular accident, old 04/18/2015  . Adult hypothyroidism 04/18/2015  . Obstructive apnea 04/18/2015  . Panic attack 04/18/2015  . Gastroduodenal ulcer 04/18/2015  . Dizziness 03/18/2015  . Abnormal vision as late effect of cerebrovascular disease 02/26/2015  . Anxiety and depression 02/15/2015  . BPH with obstruction/lower urinary tract symptoms 02/15/2015  . Acid reflux 02/15/2015  . Incomplete bladder emptying 02/15/2015  . CA of skin 02/15/2015  . Apnea, sleep 02/15/2015  . Gastric ulcer 02/15/2015  . Temporary cerebral vascular dysfunction 02/15/2015  . Chronic kidney disease (CKD), stage III (moderate) ([place]) 09/25/2014  . Benign hypertension 02/20/2014  . 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 CAD in native artery 02/20/2014  . Hypercholesterolemia without hypertriglyceridemia 02/20/2014  . Pure hypercholesterolemia 02/20/2014   Janna Arch, PT, DPT   Janna Arch 08/22/2017, 10:30 AM  [place] MAIN [facility] SERVICES [address] [place], [place], [address] Phone: [phone]   Fax:   [phone]  Name: IZEN PETZ MRN: [id number] Date of Birth: 1923-11-04

## 2017-08-24 ENCOUNTER — Ambulatory Visit: Payer: Medicare Other

## 2017-08-24 DIAGNOSIS — R2681 Unsteadiness on feet: Secondary | ICD-10-CM | POA: Diagnosis not present

## 2017-08-24 DIAGNOSIS — M6281 Muscle weakness (generalized): Secondary | ICD-10-CM

## 2017-08-24 NOTE — Therapy (Signed)
[place] MAIN [facility] SERVICES [address] [place], [place], [address] Phone: [phone]   Fax:  [phone]  Physical Therapy Treatment  Patient Details  Name: Cody Blackburn MRN: [id number] Date of Birth: [birth year] Referring Provider: Dr. Caryl Bis  Encounter Date: 08/24/2017      PT End of Session - 08/24/17 0948    Visit Number 9   Number of Visits 17   Date for PT Re-Evaluation 10/05/2017   Authorization Type G-codes    Authorization Time Period 9   Authorization - Visit Number 10   PT Start Time 0945   PT Stop Time 1030   PT Time Calculation (min) 45 min   Equipment Utilized During Treatment Gait belt   Activity Tolerance Patient tolerated treatment well;No increased pain   Behavior During Therapy [facility] for tasks assessed/performed;Anxious      Past Medical History:  Diagnosis Date  . Anxiety and depression   . BPH (benign prostatic hyperplasia)   . GERD (gastroesophageal reflux disease)   . Hypertension   . Incomplete bladder emptying   . Skin cancer    In remission for several years; bianual dermatologist apponitments  . Sleep apnea   . Stomach ulcer   . TIA (transient ischemic attack)     Past Surgical History:  Procedure Laterality Date  . CARDIAC SURGERY  2005  . CARDIAC SURGERY  1977    There were no vitals filed for this visit.      Subjective Assessment - 08/24/17 0947    Subjective Patient having a rough morning, accidently slept late and feeling rushed. Having some discomfort in L eye.    Pertinent History 83 y/o male presents with a referral for balance deficits. Pt started doing cardiorespiratory exercise about a year or two ago, but was feeling increased fatigue and switched to yoga. About 3 weeks ago he started cardio exercise at the gym again, and again had severe fatigue. He has had a feelings lightheadedness and dizziness over the last few weeks. Pt ambulates with [name] for community ambulation,  but uses no AD in his home. He reports difficulty and fear with walking over steps, such as the 2 steps to access his kitchen. Pt had recent ED visit on 8/24 due to personal concerns over his elevated BP with systolic in the 250N. The ED physician note reported that pt's BP was "a little bit elevated over baseline." Pt checks his BP regularly at home. Pt had a recent visit with his cardiologist on 05/16/17; cardiologist reports that pt's BP is "fairly well controlled".  Pt had a stroke in 2014 with residual balance deficits. Medical Hx is significant for stage III CKD, skin cancer (in remission), and anxiety/depression.    Limitations Walking;House hold activities   How long can you sit comfortably? No limitations    How long can you stand comfortably? Long enough to do yoga, 10-20 min   How long can you walk comfortably? several hunderd feet; pt is limited by leg pain.   Patient Stated Goals To improve balance    Currently in Pain? No/denies     NuStep Lvl 4 3 minutes  TherEx  Side stepping GTB 6x length of // bars   Sit to stands with 5lb bar overhead press 2x 10x   4lb ankle weights seated  Marching 20x  (2 sets)  Knee extension 12x each leg  (2 sets)   Neuro Re-ed  ambulate in hallway reading cards 200 ft, occasional stumbling  Ambulating 600 ft without AD, CGA, occasional scuffing of bilateral feet due to fatigue.    Airex pad:  Balloon toss 50x with PT, occasional forward and backwards LOB   Tossing balls into basket  15x    Pt. response to medical necessity:  Patient will continue to benefit from skilled physical therapy services to address balance deficits and improve overall safety with mobility .                         PT Education - 08/24/17 0947    Education provided Yes   Education Details body mechanics for functional balance   Person(s) Educated Patient   Methods Explanation;Demonstration;Verbal cues   Comprehension Verbalized  understanding;Returned demonstration          PT Short Term Goals - 07/27/17 1041      PT SHORT TERM GOAL #1   Title Pt will demonstrate proper sequencing with use of AD with 2 point gait pattern to improve safety and stability in gait.   Baseline Uses AD inconsistently without proprer sequencing    Time 4   Period Weeks   Status New   Target Date 08/24/17     PT SHORT TERM GOAL #2   Title Pt will perform HEP at least 3/7 days/wk in order to show meaningful improvement in strength and balance for improved functional mobility and safety.   Time 4   Period Weeks   Status New   Target Date 08/24/17           PT Long Term Goals - 07/27/17 1036      PT LONG TERM GOAL #1   Title Patient will be independent in home exercise program to improve strength/mobility for better functional independence with ADLs.   Time 8   Period Weeks   Status New   Target Date 09/21/17     PT LONG TERM GOAL #2   Title Patient will increase ABC scale score >80% to demonstrate better functional mobility and better confidence with ADLs.    Baseline 40% functioning indicating low level of physical functioning   Time 8   Period Weeks   Status New   Target Date 09/21/17     PT LONG TERM GOAL #3   Title Patient will increase Functional Gait Assessment score to >20/30 as to reduce fall risk and improve dynamic gait safety with community ambulation.   Baseline 12/30 indicating high fall risk   Time 8   Period Weeks   Status New   Target Date 09/21/17     PT LONG TERM GOAL #4   Title Patient will reduce dizziness handicap inventory score to <50, for less dizziness with ADLs and increased safety with home and work tasks.    Baseline score = 80 indicating severe disability   Time 8   Period Weeks   Status New   Target Date 09/21/17     PT LONG TERM GOAL #5   Title Patient will increase BLE gross strength to 4+/5 as to improve functional strength for independent gait, increased standing tolerance  and increased ADL ability.   Baseline Grossly 4 to 4+/5   Time 8   Period Weeks   Status New   Target Date 09/21/17               Plan - 08/24/17 1015    Clinical Impression Statement Patient progressing with ambulatory capacity with least restrictive device, demonstrating ability to ambulate >600 ft  with no AD and CGA.  Patient has decreased episodes of posterior OB. RLE weakness present throughout session.   Patient will continue to benefit from skilled physical therapy services to address balance deficits and improve overall safety with mobility .   Rehab Potential Good   Clinical Impairments Affecting Rehab Potential Anxiety and depression   PT Frequency 2x / week   PT Duration 8 weeks   PT Treatment/Interventions ADLs/Self Care Home Management;Aquatic Therapy;Biofeedback;Cryotherapy;Moist Heat;DME Instruction;Balance training;Therapeutic exercise;Therapeutic activities;Functional mobility training;Stair training;Gait training;Neuromuscular re-education;Cognitive remediation;Patient/family education;Manual techniques;Energy conservation;Visual/perceptual remediation/compensation   PT Next Visit Plan single limb stance and tandem stance   PT Home Exercise Plan Sitting t-band, will advance with balance and standing resistance    Consulted and Agree with Plan of Care Patient      Patient will benefit from skilled therapeutic intervention in order to improve the following deficits and impairments:  Abnormal gait, Decreased activity tolerance, Decreased balance, Decreased mobility, Decreased knowledge of use of DME, Decreased strength, Difficulty walking, Dizziness, Impaired perceived functional ability, Improper body mechanics, Postural dysfunction  Visit Diagnosis: Unsteadiness on feet  Muscle weakness (generalized)     Problem List Patient Active Problem List   Diagnosis Date Noted  . Exhaustion 04/20/2017  . Overweight 04/20/2017  . Anxiety 10/31/2015  . Borderline  diabetes mellitus 10/31/2015  . Recurrent major depression in remission ([place]) 10/31/2015  . Blood glucose elevated 08/06/2015  . Benign essential tremor 04/18/2015  . Borderline diabetes 04/18/2015  . Clinical depression 04/18/2015  . Cerebrovascular accident, old 04/18/2015  . Adult hypothyroidism 04/18/2015  . Obstructive apnea 04/18/2015  . Panic attack 04/18/2015  . Gastroduodenal ulcer 04/18/2015  . Dizziness 03/18/2015  . Abnormal vision as late effect of cerebrovascular disease 02/26/2015  . Anxiety and depression 02/15/2015  . BPH with obstruction/lower urinary tract symptoms 02/15/2015  . Acid reflux 02/15/2015  . Incomplete bladder emptying 02/15/2015  . CA of skin 02/15/2015  . Apnea, sleep 02/15/2015  . Gastric ulcer 02/15/2015  . Temporary cerebral vascular dysfunction 02/15/2015  . Chronic kidney disease (CKD), stage III (moderate) ([place]) 09/25/2014  . Benign hypertension 02/20/2014  . 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 CAD in native artery 02/20/2014  . Hypercholesterolemia without hypertriglyceridemia 02/20/2014  . Pure hypercholesterolemia 02/20/2014   Janna Arch, PT, DPT   Janna Arch 08/24/2017, 10:30 AM  [place] MAIN [facility] SERVICES [address] [place], [place], [address] Phone: [phone]   Fax:  [phone]  Name: Cody Blackburn MRN: [id number] Date of Birth: 1923/11/15

## 2017-08-29 ENCOUNTER — Encounter: Payer: Self-pay | Admitting: Physical Therapy

## 2017-08-29 ENCOUNTER — Ambulatory Visit: Payer: Medicare Other | Admitting: Physical Therapy

## 2017-08-29 VITALS — BP 160/61 | HR 57

## 2017-08-29 DIAGNOSIS — R2681 Unsteadiness on feet: Secondary | ICD-10-CM | POA: Diagnosis not present

## 2017-08-29 DIAGNOSIS — M6281 Muscle weakness (generalized): Secondary | ICD-10-CM

## 2017-08-29 NOTE — Therapy (Signed)
[place] MAIN [facility] SERVICES [address] [place], [place], [address] Phone: [phone]   Fax:  [phone]  Physical Therapy Treatment  Patient Details  Name: Cody Blackburn MRN: [id number] Date of Birth: July 03, 1923 Referring Provider: Dr. Caryl Bis  Encounter Date: 08/29/2017      PT End of Session - 08/29/17 1118    Visit Number 10   Number of Visits 17   Date for PT Re-Evaluation 09-28-2017   Authorization Type G-codes    Authorization Time Period 10   Authorization - Visit Number 10   PT Start Time 4709   PT Stop Time 1202   PT Time Calculation (min) 47 min   Equipment Utilized During Treatment Gait belt   Activity Tolerance Patient tolerated treatment well;No increased pain   Behavior During Therapy [facility] for tasks assessed/performed;Anxious      Past Medical History:  Diagnosis Date  . Anxiety and depression   . BPH (benign prostatic hyperplasia)   . GERD (gastroesophageal reflux disease)   . Hypertension   . Incomplete bladder emptying   . Skin cancer    In remission for several years; bianual dermatologist apponitments  . Sleep apnea   . Stomach ulcer   . TIA (transient ischemic attack)     Past Surgical History:  Procedure Laterality Date  . CARDIAC SURGERY  2005  . CARDIAC SURGERY  1977    Vitals:   08/29/17 1123  BP: (!) 160/61  Pulse: (!) 57        Subjective Assessment - 08/29/17 1121    Subjective Pt reports he is doing well today.  He reports he had one fall since last session.  He got up from bed in the night and lost his balance, knocking the bedside table lamp over, and fell onto his bed.  Pt reports he was very sore for two days following last session.    Pertinent History 83 y/o male presents with a referral for balance deficits. Pt started doing cardiorespiratory exercise about a year or two ago, but was feeling increased fatigue and switched to yoga. About 3 weeks ago he started cardio exercise  at the gym again, and again had severe fatigue. He has had a feelings lightheadedness and dizziness over the last few weeks. Pt ambulates with [name] for community ambulation, but uses no AD in his home. He reports difficulty and fear with walking over steps, such as the 2 steps to access his kitchen. Pt had recent ED visit on 8/24 due to personal concerns over his elevated BP with systolic in the 628Z. The ED physician note reported that pt's BP was "a little bit elevated over baseline." Pt checks his BP regularly at home. Pt had a recent visit with his cardiologist on 05/16/17; cardiologist reports that pt's BP is "fairly well controlled".  Pt had a stroke in 2014 with residual balance deficits. Medical Hx is significant for stage III CKD, skin cancer (in remission), and anxiety/depression.    Limitations Walking;House hold activities   How long can you sit comfortably? No limitations    How long can you stand comfortably? Long enough to do yoga, 10-20 min   How long can you walk comfortably? several hunderd feet; pt is limited by leg pain.   Patient Stated Goals To improve balance    Currently in Pain? No/denies        TREATMENT  ABC scale: 56.9%  BLE strength: Bil DF and hip flexion 4/5, otherwise strength  grossly 4+/5  Dizziness handicap inventory score: 44    Therapeutic Exercise:  Side stepping GTB 6x length of // bars  Sit to stands with 5lb bar overhead press 10x  4lb ankle weights seated, Marching 20x (2 sets)  4lb ankle weights seated, Knee extension 15x each leg (2 sets)  ?  Neuro Re-ed:  Ambulate in hallway without cane reading cards 200 ft, occasional stumbling  Ambulate in hallway without cane with spontaneous cues to ambulate forward, back, left, right.             PT Education - 08/29/17 1118    Education provided Yes   Education Details Exercise technique; instructed pt to take BP again when he goes home.     Person(s) Educated Patient   Methods  Explanation;Demonstration   Comprehension Verbalized understanding;Returned demonstration;Need further instruction          PT Short Term Goals - 07/27/17 1041      PT SHORT TERM GOAL #1   Title Pt will demonstrate proper sequencing with use of AD with 2 point gait pattern to improve safety and stability in gait.   Baseline Uses AD inconsistently without proprer sequencing    Time 4   Period Weeks   Status New   Target Date 08/24/17     PT SHORT TERM GOAL #2   Title Pt will perform HEP at least 3/7 days/wk in order to show meaningful improvement in strength and balance for improved functional mobility and safety.   Time 4   Period Weeks   Status New   Target Date 08/24/17           PT Long Term Goals - 08/29/17 1153      PT LONG TERM GOAL #1   Title Patient will be independent in home exercise program to improve strength/mobility for better functional independence with ADLs.   Time 8   Period Weeks   Status New     PT LONG TERM GOAL #2   Title Patient will increase ABC scale score >80% to demonstrate better functional mobility and better confidence with ADLs.    Baseline 40% functioning indicating low level of physical functioning; 10/15: 56.9%   Time 8   Period Weeks   Status Partially Met     PT LONG TERM GOAL #3   Title Patient will increase Functional Gait Assessment score to >20/30 as to reduce fall risk and improve dynamic gait safety with community ambulation.   Baseline 12/30 indicating high fall risk   Time 8   Period Weeks   Status New     PT LONG TERM GOAL #4   Title Patient will reduce dizziness handicap inventory score to <50, for less dizziness with ADLs and increased safety with home and work tasks.    Baseline score = 80 indicating severe disability; 10/15: 44   Time 8   Period Weeks   Status Achieved     PT LONG TERM GOAL #5   Title Patient will increase BLE gross strength to 4+/5 as to improve functional strength for independent gait,  increased standing tolerance and increased ADL ability.   Baseline Grossly 4 to 4+/5; 10:15: Bil DF and hip flexion 4/5, otherwise strength grossly 4+/5   Time 8   Period Weeks   Status On-going               Plan - 08/29/17 1139    Clinical Impression Statement Pt hesitant to push himself with strengthening exercises as  he was sore following last session.  Pt educated on the importance of challenging himself with strengthening exercises and that some soreness is okay.  Focused more on balance today as a result and in light of pt's recent fall.  Pt demonstrated improvement with his ABC scale score and met his goal for the Dizziness Handicap Inventory.  During his session today he demonstrated some unsteadiness with head turns and spontaneous direction changes when ambulating in the hallway.  He will benefit from continued skilled PT interventions for continued improvement in balance and decrease risk of falling.    Rehab Potential Good   Clinical Impairments Affecting Rehab Potential Anxiety and depression   PT Frequency 2x / week   PT Duration 8 weeks   PT Treatment/Interventions ADLs/Self Care Home Management;Aquatic Therapy;Biofeedback;Cryotherapy;Moist Heat;DME Instruction;Balance training;Therapeutic exercise;Therapeutic activities;Functional mobility training;Stair training;Gait training;Neuromuscular re-education;Cognitive remediation;Patient/family education;Manual techniques;Energy conservation;Visual/perceptual remediation/compensation   PT Next Visit Plan single limb stance and tandem stance   PT Home Exercise Plan Sitting t-band, will advance with balance and standing resistance    Consulted and Agree with Plan of Care Patient      Patient will benefit from skilled therapeutic intervention in order to improve the following deficits and impairments:  Abnormal gait, Decreased activity tolerance, Decreased balance, Decreased mobility, Decreased knowledge of use of DME, Decreased  strength, Difficulty walking, Dizziness, Impaired perceived functional ability, Improper body mechanics, Postural dysfunction  Visit Diagnosis: Unsteadiness on feet  Muscle weakness (generalized)       G-Codes - 2017/09/03 1210    Functional Assessment Tool Used (Outpatient Only) Clinical judgement, ABC scale, MMT, DHI   Functional Limitation Mobility: Walking and moving around   Mobility: Walking and Moving Around Current Status [phone]) At least 20 percent but less than 40 percent impaired, limited or restricted   Mobility: Walking and Moving Around Goal Status [phone]) At least 1 percent but less than 20 percent impaired, limited or restricted      Problem List Patient Active Problem List   Diagnosis Date Noted  . Exhaustion 04/20/2017  . Overweight 04/20/2017  . Anxiety 10/31/2015  . Borderline diabetes mellitus 10/31/2015  . Recurrent major depression in remission ([place]) 10/31/2015  . Blood glucose elevated 08/06/2015  . Benign essential tremor 04/18/2015  . Borderline diabetes 04/18/2015  . Clinical depression 04/18/2015  . Cerebrovascular accident, old 04/18/2015  . Adult hypothyroidism 04/18/2015  . Obstructive apnea 04/18/2015  . Panic attack 04/18/2015  . Gastroduodenal ulcer 04/18/2015  . Dizziness 03/18/2015  . Abnormal vision as late effect of cerebrovascular disease 02/26/2015  . Anxiety and depression 02/15/2015  . BPH with obstruction/lower urinary tract symptoms 02/15/2015  . Acid reflux 02/15/2015  . Incomplete bladder emptying 02/15/2015  . CA of skin 02/15/2015  . Apnea, sleep 02/15/2015  . Gastric ulcer 02/15/2015  . Temporary cerebral vascular dysfunction 02/15/2015  . Chronic kidney disease (CKD), stage III (moderate) ([place]) 09/25/2014  . Benign hypertension 02/20/2014  . 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 CAD in native artery 02/20/2014  . Hypercholesterolemia without hypertriglyceridemia 02/20/2014  .  Pure hypercholesterolemia 02/20/2014    Collie Siad PT, DPT 09/03/17, 12:11 PM  [place] MAIN [facility] SERVICES [address] [place], [place], [address] Phone: [phone]   Fax:  [phone]  Name: Cody Blackburn MRN: [id number] Date of Birth: 06-22-1923

## 2017-08-31 ENCOUNTER — Ambulatory Visit: Payer: Medicare Other

## 2017-08-31 DIAGNOSIS — R2681 Unsteadiness on feet: Secondary | ICD-10-CM

## 2017-08-31 DIAGNOSIS — M6281 Muscle weakness (generalized): Secondary | ICD-10-CM

## 2017-08-31 NOTE — Therapy (Signed)
[place] [facility] MAIN REHAB SERVICES [address] [place], [place], [address] Phone: [phone]   Fax:  [phone]  Physical Therapy Treatment  Patient Details  Name: Cody Blackburn MRN: [id number] Date of Birth: 03/23/1923 Referring Provider: Dr. Sonnenberg  Encounter Date: 08/31/2017      PT End of Session - 08/31/17 1119    Visit Number 11   Number of Visits 17   Date for PT Re-Evaluation 09/21/17   Authorization Type G-codes    Authorization Time Period 1   Authorization - Visit Number 10   PT Start Time 1114   PT Stop Time 1200   PT Time Calculation (min) 46 min   Equipment Utilized During Treatment Gait belt   Activity Tolerance Patient tolerated treatment well;No increased pain   Behavior During Therapy WFL for tasks assessed/performed;Anxious      Past Medical History:  Diagnosis Date  . Anxiety and depression   . BPH (benign prostatic hyperplasia)   . GERD (gastroesophageal reflux disease)   . Hypertension   . Incomplete bladder emptying   . Skin cancer    In remission for several years; bianual dermatologist apponitments  . Sleep apnea   . Stomach ulcer   . TIA (transient ischemic attack)     Past Surgical History:  Procedure Laterality Date  . CARDIAC SURGERY  2005  . CARDIAC SURGERY  1977    There were no vitals filed for this visit.      Subjective Assessment - 08/31/17 1118    Subjective Patient doing well, not feeling sore after last session. Had trouble with sit to stand intervention   Pertinent History 83 y/o male presents with a referral for balance deficits. Pt started doing cardiorespiratory exercise about a year or two ago, but was feeling increased fatigue and switched to yoga. About 3 weeks ago he started cardio exercise at the gym again, and again had severe fatigue. He has had a feelings lightheadedness and dizziness over the last few weeks. Pt ambulates with [employer] for community ambulation, but  uses no AD in his home. He reports difficulty and fear with walking over steps, such as the 2 steps to access his kitchen. Pt had recent ED visit on 8/24 due to personal concerns over his elevated BP with systolic in the 150s. The ED physician note reported that pt's BP was "a little bit elevated over baseline." Pt checks his BP regularly at home. Pt had a recent visit with his cardiologist on 05/16/17; cardiologist reports that pt's BP is "fairly well controlled".  Pt had a stroke in 2014 with residual balance deficits. Medical Hx is significant for stage III CKD, skin cancer (in remission), and anxiety/depression.    Limitations Walking;House hold activities   How long can you sit comfortably? No limitations    How long can you stand comfortably? Long enough to do yoga, 10-20 min   How long can you walk comfortably? several hunderd feet; pt is limited by leg pain.   Patient Stated Goals To improve balance    Currently in Pain? No/denies        Nustep Lvl 4 3 minutes  Neuro Re-ed  Side step over half foam roller 10x each side no UE support  Side step over orange hurdle , occasional trippage going right due to poor clearance of LLE. 15x each side.   Airex pad: tossing ball back and forth with PT, 2 incidences of LOB  Tapping of mini nubby bosu   3 colors directed by PT  One color or two consecutive color taps, decreased incidences of LOB.  Airex balance beam:   Tandem walk 10x, occasional LOB  Side step 10x. , one episode LOB   Agility ladder:   Side stepping 2x each direction  1 foot in each square. 2x  Lateral two foot in two foot out. 2x   Half foam roller balance 2 min, occasional posterior LOB  Large step forward clap and step back 10x each leg  TherEx  Heel raises 20x decreased compensatory pattern  electric slide in // bars    4" toe taps no UE support 20x no LOB   Pt. response to medical necessity:  Patient will continue to benefit from skilled physical therapy  services to address balance deficits and improve overall safety with mobility .                    PT Education - 08/31/17 1119    Education provided Yes   Education Details exercise technique, body mechanics   Person(s) Educated Patient   Methods Explanation;Tactile cues;Demonstration;Verbal cues   Comprehension Verbalized understanding;Returned demonstration          PT Short Term Goals - 07/27/17 1041      PT SHORT TERM GOAL #1   Title Pt will demonstrate proper sequencing with use of AD with 2 point gait pattern to improve safety and stability in gait.   Baseline Uses AD inconsistently without proprer sequencing    Time 4   Period Weeks   Status New   Target Date 08/24/17     PT SHORT TERM GOAL #2   Title Pt will perform HEP at least 3/7 days/wk in order to show meaningful improvement in strength and balance for improved functional mobility and safety.   Time 4   Period Weeks   Status New   Target Date 08/24/17           PT Long Term Goals - 08/29/17 1153      PT LONG TERM GOAL #1   Title Patient will be independent in home exercise program to improve strength/mobility for better functional independence with ADLs.   Time 8   Period Weeks   Status New     PT LONG TERM GOAL #2   Title Patient will increase ABC scale score >80% to demonstrate better functional mobility and better confidence with ADLs.    Baseline 40% functioning indicating low level of physical functioning; 10/15: 56.9%   Time 8   Period Weeks   Status Partially Met     PT LONG TERM GOAL #3   Title Patient will increase Functional Gait Assessment score to >20/30 as to reduce fall risk and improve dynamic gait safety with community ambulation.   Baseline 12/30 indicating high fall risk   Time 8   Period Weeks   Status New     PT LONG TERM GOAL #4   Title Patient will reduce dizziness handicap inventory score to <50, for less dizziness with ADLs and increased safety with home  and work tasks.    Baseline score = 80 indicating severe disability; 10/15: 44   Time 8   Period Weeks   Status Achieved     PT LONG TERM GOAL #5   Title Patient will increase BLE gross strength to 4+/5 as to improve functional strength for independent gait, increased standing tolerance and increased ADL ability.   Baseline Grossly 4 to 4+/5; 10:15: Bil DF  and hip flexion 4/5, otherwise strength grossly 4+/5   Time 8   Period Weeks   Status On-going               Plan - 08/31/17 1159    Clinical Impression Statement Patient demonstrates occasional posterior LOB from LE fatigue and poor ankle stability/control. Dynamic stability improving with decreased episodes of this posterior LOB, however continues to be present throughout session. Electric slide performed in // bars with no episodes of LOB. Patient will continue to benefit from skilled physical therapy services to address balance deficits and improve overall safety with mobility .   Rehab Potential Good   Clinical Impairments Affecting Rehab Potential Anxiety and depression   PT Frequency 2x / week   PT Duration 8 weeks   PT Treatment/Interventions ADLs/Self Care Home Management;Aquatic Therapy;Biofeedback;Cryotherapy;Moist Heat;DME Instruction;Balance training;Therapeutic exercise;Therapeutic activities;Functional mobility training;Stair training;Gait training;Neuromuscular re-education;Cognitive remediation;Patient/family education;Manual techniques;Energy conservation;Visual/perceptual remediation/compensation   PT Next Visit Plan single limb stance and tandem stance   PT Home Exercise Plan Sitting t-band, will advance with balance and standing resistance    Consulted and Agree with Plan of Care Patient      Patient will benefit from skilled therapeutic intervention in order to improve the following deficits and impairments:  Abnormal gait, Decreased activity tolerance, Decreased balance, Decreased mobility, Decreased  knowledge of use of DME, Decreased strength, Difficulty walking, Dizziness, Impaired perceived functional ability, Improper body mechanics, Postural dysfunction  Visit Diagnosis: Unsteadiness on feet  Muscle weakness (generalized)     Problem List Patient Active Problem List   Diagnosis Date Noted  . Exhaustion 04/20/2017  . Overweight 04/20/2017  . Anxiety 10/31/2015  . Borderline diabetes mellitus 10/31/2015  . Recurrent major depression in remission ([facility]) 10/31/2015  . Blood glucose elevated 08/06/2015  . Benign essential tremor 04/18/2015  . Borderline diabetes 04/18/2015  . Clinical depression 04/18/2015  . Cerebrovascular accident, old 04/18/2015  . Adult hypothyroidism 04/18/2015  . Obstructive apnea 04/18/2015  . Panic attack 04/18/2015  . Gastroduodenal ulcer 04/18/2015  . Dizziness 03/18/2015  . Abnormal vision as late effect of cerebrovascular disease 02/26/2015  . Anxiety and depression 02/15/2015  . BPH with obstruction/lower urinary tract symptoms 02/15/2015  . Acid reflux 02/15/2015  . Incomplete bladder emptying 02/15/2015  . CA of skin 02/15/2015  . Apnea, sleep 02/15/2015  . Gastric ulcer 02/15/2015  . Temporary cerebral vascular dysfunction 02/15/2015  . Chronic kidney disease (CKD), stage III (moderate) ([facility]) 09/25/2014  . Benign hypertension 02/20/2014  . 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 CAD in native artery 02/20/2014  . Hypercholesterolemia without hypertriglyceridemia 02/20/2014  . Pure hypercholesterolemia 02/20/2014    , PT, DPT     08/31/2017, 12:00 PM  [place] [facility] MA[facility] REHAB SERVICES [address] [place], [place], [address] Phone: [phone]   Fax:  [phone]  Name: Cleo C Moeller MRN: [id number] Date of Birth: 07/13/1923   

## 2017-09-01 ENCOUNTER — Other Ambulatory Visit: Payer: Self-pay | Admitting: Family Medicine

## 2017-09-05 ENCOUNTER — Other Ambulatory Visit: Payer: Self-pay | Admitting: Family Medicine

## 2017-09-05 ENCOUNTER — Ambulatory Visit: Payer: Medicare Other

## 2017-09-07 ENCOUNTER — Ambulatory Visit: Payer: Medicare Other

## 2017-09-12 ENCOUNTER — Ambulatory Visit: Payer: Medicare Other

## 2017-09-13 ENCOUNTER — Telehealth: Payer: Self-pay | Admitting: Family Medicine

## 2017-09-13 NOTE — Telephone Encounter (Signed)
Please confirm dose with patient. I'll consider refilling once I know this.

## 2017-09-13 NOTE — Telephone Encounter (Signed)
Filed under historical, please send to [facility] in chart

## 2017-09-13 NOTE — Telephone Encounter (Signed)
[facility] [phone] called about pt needing a refill for losartan (COZAAR) 50 MG tablet. Pt is out of town. Thank you!

## 2017-09-13 NOTE — Telephone Encounter (Signed)
Please confirm dose with patient and then I'll consider refilling. Thanks.

## 2017-09-14 ENCOUNTER — Ambulatory Visit: Payer: Medicare Other

## 2017-09-14 ENCOUNTER — Other Ambulatory Visit: Payer: Self-pay | Admitting: Family Medicine

## 2017-09-14 MED ORDER — LOSARTAN POTASSIUM 50 MG PO TABS: 50.0000 mg | ORAL_TABLET | Freq: Two times a day (BID) | ORAL | 1 refills | Status: DC

## 2017-09-14 NOTE — Telephone Encounter (Signed)
Pt is taking losartan (COZAAR) 50 MG tablet twice a day-morning and night Please send to [facility]  Please advise [phone] or [phone]

## 2017-09-14 NOTE — Telephone Encounter (Signed)
fyi

## 2017-09-14 NOTE — Telephone Encounter (Signed)
Left message to return call 

## 2017-09-14 NOTE — Telephone Encounter (Signed)
Pt is visiting his daughter in [place]

## 2017-09-14 NOTE — Telephone Encounter (Signed)
Sent to pharmacy 

## 2017-09-17 ENCOUNTER — Other Ambulatory Visit: Payer: Self-pay | Admitting: Family Medicine

## 2017-09-19 ENCOUNTER — Ambulatory Visit: Payer: Medicare Other

## 2017-09-21 ENCOUNTER — Ambulatory Visit: Payer: Medicare Other

## 2017-09-26 ENCOUNTER — Ambulatory Visit: Payer: Medicare Other | Attending: Family Medicine

## 2017-09-26 ENCOUNTER — Other Ambulatory Visit: Payer: Self-pay | Admitting: Family Medicine

## 2017-09-26 ENCOUNTER — Ambulatory Visit: Payer: Medicare Other

## 2017-09-26 DIAGNOSIS — R2681 Unsteadiness on feet: Secondary | ICD-10-CM | POA: Diagnosis not present

## 2017-09-26 DIAGNOSIS — M6281 Muscle weakness (generalized): Secondary | ICD-10-CM | POA: Insufficient documentation

## 2017-09-26 NOTE — Therapy (Signed)
[place] MAIN [facility] SERVICES [address] [place], [place], [address] Phone: [phone]   Fax:  [phone]  Physical Therapy Treatment  Patient Details  Name: Cody Blackburn MRN: [id number] Date of Birth: [birth year] Referring Provider: Dr. Caryl Bis   Encounter Date: 09/26/2017  PT End of Session - 09/27/17 1001    Visit Number  12    Number of Visits  29    Date for PT Re-Evaluation  11/07/17    Authorization Type  G-codes (next will be 1/10)    Authorization Time Period  2    Authorization - Visit Number  10    PT Start Time  1600    PT Stop Time  1645    PT Time Calculation (min)  45 min    Equipment Utilized During Treatment  Gait belt    Activity Tolerance  Patient tolerated treatment well;No increased pain    Behavior During Therapy  [facility] for tasks assessed/performed;Anxious       Past Medical History:  Diagnosis Date  . Anxiety and depression   . BPH (benign prostatic hyperplasia)   . GERD (gastroesophageal reflux disease)   . Hypertension   . Incomplete bladder emptying   . Skin cancer    In remission for several years; bianual dermatologist apponitments  . Sleep apnea   . Stomach ulcer   . TIA (transient ischemic attack)     Past Surgical History:  Procedure Laterality Date  . CARDIAC SURGERY  2005  . CARDIAC SURGERY  1977    There were no vitals filed for this visit.  Subjective Assessment - 09/26/17 1603    Subjective  Patient returned from trip and has noticed feeling a little unsteady after his prolonged time away from therapy. Patient reports no falls since last session but had multiple near falls.     Pertinent History  83 y/o male presents with a referral for balance deficits. Pt started doing cardiorespiratory exercise about a year or two ago, but was feeling increased fatigue and switched to yoga. About 3 weeks ago he started cardio exercise at the gym again, and again had severe fatigue. He has had a  feelings lightheadedness and dizziness over the last few weeks. Pt ambulates with [name] for community ambulation, but uses no AD in his home. He reports difficulty and fear with walking over steps, such as the 2 steps to access his kitchen. Pt had recent ED visit on 8/24 due to personal concerns over his elevated BP with systolic in the 644I. The ED physician note reported that pt's BP was "a little bit elevated over baseline." Pt checks his BP regularly at home. Pt had a recent visit with his cardiologist on 05/16/17; cardiologist reports that pt's BP is "fairly well controlled".  Pt had a stroke in 2014 with residual balance deficits. Medical Hx is significant for stage III CKD, skin cancer (in remission), and anxiety/depression.     Limitations  Walking;House hold activities    How long can you sit comfortably?  No limitations     How long can you stand comfortably?  Long enough to do yoga, 10-20 min    How long can you walk comfortably?  several hunderd feet; pt is limited by leg pain.    Patient Stated Goals  To improve balance     Currently in Pain?  No/denies      ABC=53% Functional gait assessment: 18/30 DHI: 50 BLE strength: grossly 4+/5  neuro re-ed airex pad:   Static stand  Horizontal head turns 60 seconds  Vertical head turns 60 seconds posterior LOB    Step over and back over orange hurdle 10x each leg, required SUE support Side step over and back orange hurdle 10x each leg required BUE support.  Balance on half foam roller with rounded portion on top    TherEx Scapular retractions seated 2x10x  high knee marches in // bars 4x length of bars, cues for decreasing speed seated  Row OTB x 12  Seated thoracic extension over chair               PT Education - 09/26/17 1605    Education provided  Yes    Education Details  POC    Person(s) Educated  Patient    Methods  Explanation    Comprehension  Verbalized understanding       PT Short Term Goals -  09/27/17 0955      PT SHORT TERM GOAL #1   Title  Pt will demonstrate proper sequencing with use of AD with 2 point gait pattern to improve safety and stability in gait.    Baseline  occasionally uses cane for ambulating in public    Time  4    Period  Weeks    Status  Partially Met      PT SHORT TERM GOAL #2   Title  Pt will perform HEP at least 3/7 days/wk in order to show meaningful improvement in strength and balance for improved functional mobility and safety.    Baseline  HEP Compliance and progression    Time  4    Period  Weeks    Status  Partially Met        PT Long Term Goals - 09/26/17 1606      PT LONG TERM GOAL #1   Title  Patient will be independent in home exercise program to improve strength/mobility for better functional independence with ADLs.    Time  6    Period  Weeks    Status  Partially Met      PT LONG TERM GOAL #2   Title  Patient will increase ABC scale score >80% to demonstrate better functional mobility and better confidence with ADLs.     Baseline  40% functioning indicating low level of physical functioning; 10/15: 56.9% 11/12: 53%     Time  6    Period  Weeks    Status  Partially Met      PT LONG TERM GOAL #3   Title  Patient will increase Functional Gait Assessment score to >20/30 as to reduce fall risk and improve dynamic gait safety with community ambulation.    Baseline  12/30 indicating high fall risk 11/12: 18/30     Time  6    Period  Weeks    Status  Partially Met      PT LONG TERM GOAL #4   Title  Patient will reduce dizziness handicap inventory score to <50, for less dizziness with ADLs and increased safety with home and work tasks.     Baseline  score = 80 indicating severe disability; 10/15: 44 11/13: 50    Time  8    Period  Weeks    Status  Achieved      PT LONG TERM GOAL #5   Title  Patient will increase BLE gross strength to 4+/5 as to improve functional strength for independent gait, increased standing tolerance  and  increased ADL ability.    Baseline  Grossly 4 to 4+/5; 10:15: Bil DF and hip flexion 4/5, otherwise strength grossly 4+/5 grossly 4+/5     Time  8    Period  Weeks    Status  Achieved            Plan - 2017/10/01 1000    Clinical Impression Statement  Patient has slight plateau since last session due to month long absence from therapy while out of town. DHI= 50, DGI=18/30, ABC= 53%, and MMT improved 4+/5. Occasional posterior LOB occurred due to fatigue and weak ankle stability.  Patient will continue to benefit from skilled physical therapy services to address balance deficits and improve overall safety with movement.     Rehab Potential  Good    Clinical Impairments Affecting Rehab Potential  Anxiety and depression    PT Frequency  2x / week    PT Duration  8 weeks    PT Treatment/Interventions  ADLs/Self Care Home Management;Aquatic Therapy;Biofeedback;Cryotherapy;Moist Heat;DME Instruction;Balance training;Therapeutic exercise;Therapeutic activities;Functional mobility training;Stair training;Gait training;Neuromuscular re-education;Cognitive remediation;Patient/family education;Manual techniques;Energy conservation;Visual/perceptual remediation/compensation    PT Next Visit Plan  single limb stance and tandem stance    PT Home Exercise Plan  Sitting t-band, will advance with balance and standing resistance     Consulted and Agree with Plan of Care  Patient       Patient will benefit from skilled therapeutic intervention in order to improve the following deficits and impairments:  Abnormal gait, Decreased activity tolerance, Decreased balance, Decreased mobility, Decreased knowledge of use of DME, Decreased strength, Difficulty walking, Dizziness, Impaired perceived functional ability, Improper body mechanics, Postural dysfunction  Visit Diagnosis: Unsteadiness on feet  Muscle weakness (generalized)   G-Codes - 10/01/17 1003    Functional Assessment Tool Used (Outpatient Only)   Clinical judgement, ABC scale, MMT, DHI, DGI    Functional Limitation  Mobility: Walking and moving around    Mobility: Walking and Moving Around Current Status [phone])  At least 20 percent but less than 40 percent impaired, limited or restricted    Mobility: Walking and Moving Around Goal Status [phone])  At least 1 percent but less than 20 percent impaired, limited or restricted       Problem List Patient Active Problem List   Diagnosis Date Noted  . Exhaustion 04/20/2017  . Overweight 04/20/2017  . Anxiety 10/31/2015  . Borderline diabetes mellitus 10/31/2015  . Recurrent major depression in remission ([place]) 10/31/2015  . Blood glucose elevated 08/06/2015  . Benign essential tremor 04/18/2015  . Borderline diabetes 04/18/2015  . Clinical depression 04/18/2015  . Cerebrovascular accident, old 04/18/2015  . Adult hypothyroidism 04/18/2015  . Obstructive apnea 04/18/2015  . Panic attack 04/18/2015  . Gastroduodenal ulcer 04/18/2015  . Dizziness 03/18/2015  . Abnormal vision as late effect of cerebrovascular disease 02/26/2015  . Anxiety and depression 02/15/2015  . BPH with obstruction/lower urinary tract symptoms 02/15/2015  . Acid reflux 02/15/2015  . Incomplete bladder emptying 02/15/2015  . CA of skin 02/15/2015  . Apnea, sleep 02/15/2015  . Gastric ulcer 02/15/2015  . Temporary cerebral vascular dysfunction 02/15/2015  . Chronic kidney disease (CKD), stage III (moderate) ([place]) 09/25/2014  . Benign hypertension 02/20/2014  . 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 CAD in native artery 02/20/2014  . Hypercholesterolemia without hypertriglyceridemia 02/20/2014  . Pure hypercholesterolemia 02/20/2014   Janna Arch, PT, DPT   Janna Arch 10-01-2017, 10:04 AM  [place]  [place] MAIN [facility] SERVICES [place], [place], [address] Phone: [phone]   Fax:  [phone]  Name: MIKIAS LANZ MRN: [id number] Date of Birth: August 14, 1923

## 2017-09-28 ENCOUNTER — Ambulatory Visit: Payer: Medicare Other

## 2017-09-28 DIAGNOSIS — R2681 Unsteadiness on feet: Secondary | ICD-10-CM

## 2017-09-28 DIAGNOSIS — M6281 Muscle weakness (generalized): Secondary | ICD-10-CM

## 2017-09-28 NOTE — Therapy (Signed)
[place] MAIN [facility] SERVICES [address] [place], [place], [address] Phone: [phone]   Fax:  [phone]  Physical Therapy Treatment  Patient Details  Name: Cody Blackburn MRN: [id number] Date of Birth: Apr 01, 1923 Referring Provider: Dr. Caryl Bis   Encounter Date: 09/28/2017  PT End of Session - 09/28/17 1435    Visit Number  13    Number of Visits  29    Date for PT Re-Evaluation  11/07/17    Authorization Time Period  1    Authorization - Visit Number  10    PT Start Time  1430    PT Stop Time  1515    PT Time Calculation (min)  45 min    Equipment Utilized During Treatment  Gait belt    Activity Tolerance  Patient tolerated treatment well;No increased pain    Behavior During Therapy  [facility] for tasks assessed/performed;Anxious       Past Medical History:  Diagnosis Date  . Anxiety and depression   . BPH (benign prostatic hyperplasia)   . GERD (gastroesophageal reflux disease)   . Hypertension   . Incomplete bladder emptying   . Skin cancer    In remission for several years; bianual dermatologist apponitments  . Sleep apnea   . Stomach ulcer   . TIA (transient ischemic attack)     Past Surgical History:  Procedure Laterality Date  . CARDIAC SURGERY  2005  . CARDIAC SURGERY  1977    There were no vitals filed for this visit.  Subjective Assessment - 09/28/17 1434    Subjective  Patient feeling good after last session. [place]     Pertinent History  83 y/o male presents with a referral for balance deficits. Pt started doing cardiorespiratory exercise about a year or two ago, but was feeling increased fatigue and switched to yoga. About 3 weeks ago he started cardio exercise at the gym again, and again had severe fatigue. He has had a feelings lightheadedness and dizziness over the last few weeks. Pt ambulates with [name] for community ambulation, but uses no AD in his home.  He reports difficulty and fear with walking over steps, such as the 2 steps to access his kitchen. Pt had recent ED visit on 8/24 due to personal concerns over his elevated BP with systolic in the 366Q. The ED physician note reported that pt's BP was "a little bit elevated over baseline." Pt checks his BP regularly at home. Pt had a recent visit with his cardiologist on 05/16/17; cardiologist reports that pt's BP is "fairly well controlled".  Pt had a stroke in 2014 with residual balance deficits. Medical Hx is significant for stage III CKD, skin cancer (in remission), and anxiety/depression.     Limitations  Walking;House hold activities    How long can you sit comfortably?  No limitations     How long can you stand comfortably?  Long enough to do yoga, 10-20 min    How long can you walk comfortably?  several hunderd feet; pt is limited by leg pain.    Patient Stated Goals  To improve balance     Currently in Pain?  No/denies       NuStep Lvl 4 3 minutes   TherEx   Side stepping GTB 6x length of // bars    Seated marching GTB around knees 20x    5lb ankle weights  Seated knee extensions 12x each leg             Standing hip extensions 12x            Standing hip flexion 12x each leg            Standing abduction 12x each leg            Standing hamstring curls 12 x each leg    Neuro Re-ed ambulate in hallway reading cards 100 ft, occasional stumbling with legs extended, performed 6x    Airex pad balance beam : tandem walk with SUE support 4x single UE support, 2x no UE support  Airex balance beam: side step no UE support    Airex pad side step over cone onto additional airex pad 15x each leg more challenging with RLE.    Airex pad forward step over cone and back onto additional airex pad, 15x each leg with SUE support  Tandem line walk // bars   Pt. response to medical necessity:  Patient will continue to benefit from skilled physical therapy services to address  balance deficits and improve overall safety with mobility                        PT Education - 09/28/17 1435    Education provided  Yes    Education Details  functional body mechanics for safe mobility     Person(s) Educated  Patient    Methods  Explanation;Demonstration;Verbal cues    Comprehension  Verbalized understanding;Returned demonstration       PT Short Term Goals - 09/27/17 0955      PT SHORT TERM GOAL #1   Title  Pt will demonstrate proper sequencing with use of AD with 2 point gait pattern to improve safety and stability in gait.    Baseline  occasionally uses cane for ambulating in public    Time  4    Period  Weeks    Status  Partially Met      PT SHORT TERM GOAL #2   Title  Pt will perform HEP at least 3/7 days/wk in order to show meaningful improvement in strength and balance for improved functional mobility and safety.    Baseline  HEP Compliance and progression    Time  4    Period  Weeks    Status  Partially Met        PT Long Term Goals - 09/26/17 1606      PT LONG TERM GOAL #1   Title  Patient will be independent in home exercise program to improve strength/mobility for better functional independence with ADLs.    Time  6    Period  Weeks    Status  Partially Met      PT LONG TERM GOAL #2   Title  Patient will increase ABC scale score >80% to demonstrate better functional mobility and better confidence with ADLs.     Baseline  40% functioning indicating low level of physical functioning; 10/15: 56.9% 11/12: 53%     Time  6    Period  Weeks    Status  Partially Met      PT LONG TERM GOAL #3   Title  Patient will increase Functional Gait Assessment score to >20/30 as to reduce fall risk and improve dynamic gait safety with community ambulation.    Baseline  12/30 indicating high fall risk 11/12: 18/30     Time  6    Period  Weeks    Status  Partially Met      PT LONG TERM GOAL #4   Title  Patient will reduce dizziness  handicap inventory score to <50, for less dizziness with ADLs and increased safety with home and work tasks.     Baseline  score = 80 indicating severe disability; 10/15: 44 2023-10-21: 50    Time  8    Period  Weeks    Status  Achieved      PT LONG TERM GOAL #5   Title  Patient will increase BLE gross strength to 4+/5 as to improve functional strength for independent gait, increased standing tolerance and increased ADL ability.    Baseline  Grossly 4 to 4+/5; 10:15: Bil DF and hip flexion 4/5, otherwise strength grossly 4+/5 grossly 4+/5     Time  8    Period  Weeks    Status  Achieved            Plan - 09/28/17 1503    Clinical Impression Statement  Patient challenged by dynamic gait with head turns and by single limb stance on dynamic surfaces due to reliance upon single limb stance and poor stability of RLE.  Patient will continue to benefit from skilled physical therapy services to address balance deficits and improve overall safety with mobility    Rehab Potential  Good    Clinical Impairments Affecting Rehab Potential  Anxiety and depression    PT Frequency  2x / week    PT Duration  8 weeks    PT Treatment/Interventions  ADLs/Self Care Home Management;Aquatic Therapy;Biofeedback;Cryotherapy;Moist Heat;DME Instruction;Balance training;Therapeutic exercise;Therapeutic activities;Functional mobility training;Stair training;Gait training;Neuromuscular re-education;Cognitive remediation;Patient/family education;Manual techniques;Energy conservation;Visual/perceptual remediation/compensation    PT Next Visit Plan  single limb stance and tandem stance    PT Home Exercise Plan  Sitting t-band, will advance with balance and standing resistance     Consulted and Agree with Plan of Care  Patient       Patient will benefit from skilled therapeutic intervention in order to improve the following deficits and impairments:  Abnormal gait, Decreased activity tolerance, Decreased balance, Decreased  mobility, Decreased knowledge of use of DME, Decreased strength, Difficulty walking, Dizziness, Impaired perceived functional ability, Improper body mechanics, Postural dysfunction  Visit Diagnosis: Unsteadiness on feet  Muscle weakness (generalized)   G-Codes - 10/20/17 1003    Functional Assessment Tool Used (Outpatient Only)  Clinical judgement, ABC scale, MMT, DHI, DGI    Functional Limitation  Mobility: Walking and moving around    Mobility: Walking and Moving Around Current Status [phone])  At least 20 percent but less than 40 percent impaired, limited or restricted    Mobility: Walking and Moving Around Goal Status [phone])  At least 1 percent but less than 20 percent impaired, limited or restricted       Problem List Patient Active Problem List   Diagnosis Date Noted  . Exhaustion 04/20/2017  . Overweight 04/20/2017  . Anxiety 10/31/2015  . Borderline diabetes mellitus 10/31/2015  . Recurrent major depression in remission ([place]) 10/31/2015  . Blood glucose elevated 08/06/2015  . Benign essential tremor 04/18/2015  . Borderline diabetes 04/18/2015  . Clinical depression 04/18/2015  . Cerebrovascular accident, old 04/18/2015  . Adult hypothyroidism 04/18/2015  . Obstructive apnea 04/18/2015  . Panic attack 04/18/2015  . Gastroduodenal ulcer 04/18/2015  . Dizziness 03/18/2015  . Abnormal vision as late effect of cerebrovascular disease 02/26/2015  . Anxiety and depression 02/15/2015  .  BPH with obstruction/lower urinary tract symptoms 02/15/2015  . Acid reflux 02/15/2015  . Incomplete bladder emptying 02/15/2015  . CA of skin 02/15/2015  . Apnea, sleep 02/15/2015  . Gastric ulcer 02/15/2015  . Temporary cerebral vascular dysfunction 02/15/2015  . Chronic kidney disease (CKD), stage III (moderate) ([place]) 09/25/2014  . Benign hypertension 02/20/2014  . 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 CAD in native artery 02/20/2014  .  Hypercholesterolemia without hypertriglyceridemia 02/20/2014  . Pure hypercholesterolemia 02/20/2014   Janna Arch, PT, DPT   Janna Arch 09/28/2017, 3:17 PM  [place] MAIN [facility] SERVICES [address] [place], [place], [address] Phone: [phone]   Fax:  [phone]  Name: Cody Blackburn MRN: [id number] Date of Birth: [birth year]

## 2017-10-03 ENCOUNTER — Ambulatory Visit: Payer: Medicare Other

## 2017-10-03 DIAGNOSIS — R2681 Unsteadiness on feet: Secondary | ICD-10-CM

## 2017-10-03 DIAGNOSIS — M6281 Muscle weakness (generalized): Secondary | ICD-10-CM

## 2017-10-03 NOTE — Therapy (Signed)
[place] MAIN [facility] SERVICES [address] [place], [place], [address] Phone: [phone]   Fax:  [phone]  Physical Therapy Treatment  Patient Details  Name: Cody Blackburn MRN: [id number] Date of Birth: September 17, 1923 Referring Provider: Dr. Caryl Bis   Encounter Date: 10/03/2017  PT End of Session - 10/03/17 1519    Visit Number  14    Number of Visits  29    Date for PT Re-Evaluation  11/07/17    Authorization Time Period  2    Authorization - Visit Number  10    PT Start Time  9166    PT Stop Time  1600    PT Time Calculation (min)  45 min    Equipment Utilized During Treatment  Gait belt    Activity Tolerance  Patient tolerated treatment well;No increased pain    Behavior During Therapy  [name] for tasks assessed/performed;Anxious       Past Medical History:  Diagnosis Date  . Anxiety and depression   . BPH (benign prostatic hyperplasia)   . GERD (gastroesophageal reflux disease)   . Hypertension   . Incomplete bladder emptying   . Skin cancer    In remission for several years; bianual dermatologist apponitments  . Sleep apnea   . Stomach ulcer   . TIA (transient ischemic attack)     Past Surgical History:  Procedure Laterality Date  . CARDIAC SURGERY  2005  . CARDIAC SURGERY  1977    There were no vitals filed for this visit.  Subjective Assessment - 10/03/17 1518    Subjective  Patient had a productive morning, ready to progress towards more independent program after this week's sessions. No LOB or stumbles since last session.     Pertinent History  83 y/o male presents with a referral for balance deficits. Pt started doing cardiorespiratory exercise about a year or two ago, but was feeling increased fatigue and switched to yoga. About 3 weeks ago he started cardio exercise at the gym again, and again had severe fatigue. He has had a feelings lightheadedness and dizziness over the last few weeks. Pt ambulates with [name] for community ambulation, but uses no AD in his home. He reports difficulty and fear with walking over steps, such as the 2 steps to access his kitchen. Pt had recent ED visit on 8/24 due to personal concerns over his elevated BP with systolic in the 060O. The ED physician note reported that pt's BP was "a little bit elevated over baseline." Pt checks his BP regularly at home. Pt had a recent visit with his cardiologist on 05/16/17; cardiologist reports that pt's BP is "fairly well controlled".  Pt had a stroke in 2014 with residual balance deficits. Medical Hx is significant for stage III CKD, skin cancer (in remission), and anxiety/depression.     Limitations  Walking;House hold activities    How long can you sit comfortably?  No limitations     How long can you stand comfortably?  Long enough to do yoga, 10-20 min    How long can you walk comfortably?  several hunderd feet; pt is limited by leg pain.    Patient Stated Goals  To improve balance     Currently in Pain?  No/denies      NuStep Lvl 4 3 minutes   TherEx   6" step toe taps no UE support 20x     5lb ankle weights  Standing hip extensions 12x           Standing hip flexion 12x each leg          Standing abduction 12x each leg          Standing hamstring curls 12 x each leg    Neuro Re-ed ambulate in hallway reading cards 100 ft, occasional stumbling with legs extended, performed 6x    Airex pad: horizontal head turns 20x, vertical head turns 20x eyes closed 60 seconds, backwards trunk lean  Airex pad: ball toss  20 balls   Stand on half foam roller 2x 60 seconds , no LOB  Airex pad side step over cone onto additional airex pad 15x each leg more challenging with RLE.    Airex pad forward step over cone and back onto additional airex pad, 15x each leg with SUE support   Tandem line walk // bars    Pt. response to medical necessity:  Patient will continue to benefit from skilled physical therapy services to  address balance deficits and improve overall safety with mobility                          PT Education - 10/03/17 1518    Education provided  Yes    Education Details  progression of HEP, functional strength and balance    Person(s) Educated  Patient    Methods  Explanation;Demonstration;Verbal cues    Comprehension  Verbalized understanding;Returned demonstration       PT Short Term Goals - 09/27/17 0955      PT SHORT TERM GOAL #1   Title  Pt will demonstrate proper sequencing with use of AD with 2 point gait pattern to improve safety and stability in gait.    Baseline  occasionally uses cane for ambulating in public    Time  4    Period  Weeks    Status  Partially Met      PT SHORT TERM GOAL #2   Title  Pt will perform HEP at least 3/7 days/wk in order to show meaningful improvement in strength and balance for improved functional mobility and safety.    Baseline  HEP Compliance and progression    Time  4    Period  Weeks    Status  Partially Met        PT Long Term Goals - 09/26/17 1606      PT LONG TERM GOAL #1   Title  Patient will be independent in home exercise program to improve strength/mobility for better functional independence with ADLs.    Time  6    Period  Weeks    Status  Partially Met      PT LONG TERM GOAL #2   Title  Patient will increase ABC scale score >80% to demonstrate better functional mobility and better confidence with ADLs.     Baseline  40% functioning indicating low level of physical functioning; 10/15: 56.9% 11/12: 53%     Time  6    Period  Weeks    Status  Partially Met      PT LONG TERM GOAL #3   Title  Patient will increase Functional Gait Assessment score to >20/30 as to reduce fall risk and improve dynamic gait safety with community ambulation.    Baseline  12/30 indicating high fall risk 11/12: 18/30     Time  6    Period  Weeks  Status  Partially Met      PT LONG TERM GOAL #4   Title  Patient will  reduce dizziness handicap inventory score to <50, for less dizziness with ADLs and increased safety with home and work tasks.     Baseline  score = 80 indicating severe disability; 10/15: 44 11/13: 50    Time  8    Period  Weeks    Status  Achieved      PT LONG TERM GOAL #5   Title  Patient will increase BLE gross strength to 4+/5 as to improve functional strength for independent gait, increased standing tolerance and increased ADL ability.    Baseline  Grossly 4 to 4+/5; 10:15: Bil DF and hip flexion 4/5, otherwise strength grossly 4+/5 grossly 4+/5     Time  8    Period  Weeks    Status  Achieved            Plan - 10/03/17 1553    Clinical Impression Statement  Patient has stable static balance with minimal posterior tilt only present upon eyes closed. Occasional stumbling upon dynamic motion performed.  Patient will continue to benefit from skilled physical therapy services to address balance deficits and improve overall safety with mobility    Rehab Potential  Good    Clinical Impairments Affecting Rehab Potential  Anxiety and depression    PT Frequency  2x / week    PT Duration  8 weeks    PT Treatment/Interventions  ADLs/Self Care Home Management;Aquatic Therapy;Biofeedback;Cryotherapy;Moist Heat;DME Instruction;Balance training;Therapeutic exercise;Therapeutic activities;Functional mobility training;Stair training;Gait training;Neuromuscular re-education;Cognitive remediation;Patient/family education;Manual techniques;Energy conservation;Visual/perceptual remediation/compensation    PT Next Visit Plan  d/c    PT Home Exercise Plan  Sitting t-band, will advance with balance and standing resistance     Consulted and Agree with Plan of Care  Patient       Patient will benefit from skilled therapeutic intervention in order to improve the following deficits and impairments:  Abnormal gait, Decreased activity tolerance, Decreased balance, Decreased mobility, Decreased knowledge of  use of DME, Decreased strength, Difficulty walking, Dizziness, Impaired perceived functional ability, Improper body mechanics, Postural dysfunction  Visit Diagnosis: Unsteadiness on feet  Muscle weakness (generalized)     Problem List Patient Active Problem List   Diagnosis Date Noted  . Exhaustion 04/20/2017  . Overweight 04/20/2017  . Anxiety 10/31/2015  . Borderline diabetes mellitus 10/31/2015  . Recurrent major depression in remission ([place]) 10/31/2015  . Blood glucose elevated 08/06/2015  . Benign essential tremor 04/18/2015  . Borderline diabetes 04/18/2015  . Clinical depression 04/18/2015  . Cerebrovascular accident, old 04/18/2015  . Adult hypothyroidism 04/18/2015  . Obstructive apnea 04/18/2015  . Panic attack 04/18/2015  . Gastroduodenal ulcer 04/18/2015  . Dizziness 03/18/2015  . Abnormal vision as late effect of cerebrovascular disease 02/26/2015  . Anxiety and depression 02/15/2015  . BPH with obstruction/lower urinary tract symptoms 02/15/2015  . Acid reflux 02/15/2015  . Incomplete bladder emptying 02/15/2015  . CA of skin 02/15/2015  . Apnea, sleep 02/15/2015  . Gastric ulcer 02/15/2015  . Temporary cerebral vascular dysfunction 02/15/2015  . Chronic kidney disease (CKD), stage III (moderate) ([place]) 09/25/2014  . Benign hypertension 02/20/2014  . 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 CAD in native artery 02/20/2014  . Hypercholesterolemia without hypertriglyceridemia 02/20/2014  . Pure hypercholesterolemia 02/20/2014   Janna Arch, PT, DPT   Janna Arch 10/03/2017, 4:01 PM  [place] MAIN  [place], [place], [address] Phone: [phone]   Fax:  [phone]  Name: Cody Blackburn MRN: [id number] Date of Birth: [birth year]

## 2017-10-05 ENCOUNTER — Ambulatory Visit: Payer: Medicare Other

## 2017-10-05 DIAGNOSIS — R2681 Unsteadiness on feet: Secondary | ICD-10-CM

## 2017-10-05 DIAGNOSIS — M6281 Muscle weakness (generalized): Secondary | ICD-10-CM

## 2017-10-05 NOTE — Therapy (Signed)
[place] MAIN [facility] SERVICES [address] [place], [place], [address] Phone: [phone]   Fax:  [phone]  Physical Therapy Treatment  Patient Details  Name: Cody Blackburn MRN: [id number] Date of Birth: 1923-07-23 Referring Provider: Dr. Caryl Bis   Encounter Date: 10/05/2017  PT End of Session - 10/05/17 1523    Visit Number  15    Number of Visits  29    Date for PT Re-Evaluation  11/07/17    Authorization Time Period  3    Authorization - Visit Number  10    PT Start Time  8280    PT Stop Time  1600    PT Time Calculation (min)  45 min    Equipment Utilized During Treatment  Gait belt    Activity Tolerance  Patient tolerated treatment well;No increased pain    Behavior During Therapy  [facility] for tasks assessed/performed;Anxious       Past Medical History:  Diagnosis Date  . Anxiety and depression   . BPH (benign prostatic hyperplasia)   . GERD (gastroesophageal reflux disease)   . Hypertension   . Incomplete bladder emptying   . Skin cancer    In remission for several years; bianual dermatologist apponitments  . Sleep apnea   . Stomach ulcer   . TIA (transient ischemic attack)     Past Surgical History:  Procedure Laterality Date  . CARDIAC SURGERY  2005  . CARDIAC SURGERY  1977    There were no vitals filed for this visit.  Subjective Assessment - 10/05/17 1522    Subjective  Patient preparing for the holidays. Is feeling more steady on feet. No LOB or stumbles since last session.     Pertinent History  83 y/o male presents with a referral for balance deficits. Pt started doing cardiorespiratory exercise about a year or two ago, but was feeling increased fatigue and switched to yoga. About 3 weeks ago he started cardio exercise at the gym again, and again had severe fatigue. He has had a feelings lightheadedness and dizziness over the last few weeks. Pt ambulates with [name] for community ambulation, but uses no AD  in his home. He reports difficulty and fear with walking over steps, such as the 2 steps to access his kitchen. Pt had recent ED visit on 8/24 due to personal concerns over his elevated BP with systolic in the 034J. The ED physician note reported that pt's BP was "a little bit elevated over baseline." Pt checks his BP regularly at home. Pt had a recent visit with his cardiologist on 05/16/17; cardiologist reports that pt's BP is "fairly well controlled".  Pt had a stroke in 2014 with residual balance deficits. Medical Hx is significant for stage III CKD, skin cancer (in remission), and anxiety/depression.     Limitations  Walking;House hold activities    How long can you sit comfortably?  No limitations     How long can you stand comfortably?  Long enough to do yoga, 10-20 min    How long can you walk comfortably?  several hunderd feet; pt is limited by leg pain.    Patient Stated Goals  To improve balance     Currently in Pain?  No/denies       ABC=60% FGI= 23/30    TherEx   6" step toe taps no UE support 20x  , one episode of LOB  6" side step toe tap no UE support 20x, no episodes of  LOB  High knee marching 8x     Neuro Re-ed Large step forward clap and step back 10x each leg  Airex pad: horizontal head turns 20x, vertical head turns 20x eyes closed 60 seconds, backwards trunk lean    Large step forward with opp UE swing for 10x each side, no LOB, cueing for increasing step length  Stand on half foam roller 2x 60 seconds , no LOB   Airex pad side step over cone onto additional airex pad 15x each leg more challenging with RLE.    Airex pad forward step over cone and back onto additional airex pad, 15x each leg with SUE support   Tandem line walk // bars    Pt. response to medical necessity:  Patient will continue to benefit from skilled physical therapy services to address balance deficits and improve overall safety with mobility                     PT  Education - 10/05/17 1522    Education provided  Yes    Education Details  POC, home program     Person(s) Educated  Patient    Methods  Explanation    Comprehension  Verbalized understanding       PT Short Term Goals - 10/05/17 1600      PT SHORT TERM GOAL #1   Title  Pt will demonstrate proper sequencing with use of AD with 2 point gait pattern to improve safety and stability in gait.    Baseline  occasionally uses cane for ambulating in public    Time  4    Period  Weeks    Status  Achieved      PT SHORT TERM GOAL #2   Title  Pt will perform HEP at least 3/7 days/wk in order to show meaningful improvement in strength and balance for improved functional mobility and safety.    Baseline  HEP Compliance and progression    Time  4    Period  Weeks    Status  Achieved        PT Long Term Goals - 10/05/17 1559      PT LONG TERM GOAL #1   Title  Patient will be independent in home exercise program to improve strength/mobility for better functional independence with ADLs.    Baseline  Independent with HEP    Time  6    Period  Weeks    Status  Achieved      PT LONG TERM GOAL #2   Title  Patient will increase ABC scale score >80% to demonstrate better functional mobility and better confidence with ADLs.     Baseline  40% functioning indicating low level of physical functioning; 10/15: 56.9% 11/12: 53% 11/21: 60%    Time  6    Period  Weeks    Status  Partially Met      PT LONG TERM GOAL #3   Title  Patient will increase Functional Gait Assessment score to >20/30 as to reduce fall risk and improve dynamic gait safety with community ambulation.    Baseline  12/30 indicating high fall risk 11/12: 18/30 11/21: 23/30    Time  6    Period  Weeks    Status  Achieved      PT LONG TERM GOAL #4   Title  Patient will reduce dizziness handicap inventory score to <50, for less dizziness with ADLs and increased safety with home and work tasks.  Baseline  score = 80 indicating  severe disability; 10/15: 44 11/13: 50    Time  8    Period  Weeks    Status  Achieved      PT LONG TERM GOAL #5   Title  Patient will increase BLE gross strength to 4+/5 as to improve functional strength for independent gait, increased standing tolerance and increased ADL ability.    Baseline  Grossly 4 to 4+/5; 10:15: Bil DF and hip flexion 4/5, otherwise strength grossly 4+/5 grossly 4+/5     Time  8    Period  Weeks    Status  Achieved            Plan - 10/05/17 1601    Clinical Impression Statement  Patient progressing towards all goals at this time. Reports compliance with Hep and preference for more independent home based program. Due to patient progress and upcoming travels patient will be progressed to home program. I will be happy to see patient again in the future as needed.     Rehab Potential  Good    Clinical Impairments Affecting Rehab Potential  Anxiety and depression    PT Frequency  2x / week    PT Duration  8 weeks    PT Treatment/Interventions  ADLs/Self Care Home Management;Aquatic Therapy;Biofeedback;Cryotherapy;Moist Heat;DME Instruction;Balance training;Therapeutic exercise;Therapeutic activities;Functional mobility training;Stair training;Gait training;Neuromuscular re-education;Cognitive remediation;Patient/family education;Manual techniques;Energy conservation;Visual/perceptual remediation/compensation    PT Next Visit Plan  d/c    PT Home Exercise Plan  Sitting t-band, will advance with balance and standing resistance     Consulted and Agree with Plan of Care  Patient       Patient will benefit from skilled therapeutic intervention in order to improve the following deficits and impairments:  Abnormal gait, Decreased activity tolerance, Decreased balance, Decreased mobility, Decreased knowledge of use of DME, Decreased strength, Difficulty walking, Dizziness, Impaired perceived functional ability, Improper body mechanics, Postural dysfunction  Visit  Diagnosis: Unsteadiness on feet  Muscle weakness (generalized)     Problem List Patient Active Problem List   Diagnosis Date Noted  . Exhaustion 04/20/2017  . Overweight 04/20/2017  . Anxiety 10/31/2015  . Borderline diabetes mellitus 10/31/2015  . Recurrent major depression in remission ([place]) 10/31/2015  . Blood glucose elevated 08/06/2015  . Benign essential tremor 04/18/2015  . Borderline diabetes 04/18/2015  . Clinical depression 04/18/2015  . Cerebrovascular accident, old 04/18/2015  . Adult hypothyroidism 04/18/2015  . Obstructive apnea 04/18/2015  . Panic attack 04/18/2015  . Gastroduodenal ulcer 04/18/2015  . Dizziness 03/18/2015  . Abnormal vision as late effect of cerebrovascular disease 02/26/2015  . Anxiety and depression 02/15/2015  . BPH with obstruction/lower urinary tract symptoms 02/15/2015  . Acid reflux 02/15/2015  . Incomplete bladder emptying 02/15/2015  . CA of skin 02/15/2015  . Apnea, sleep 02/15/2015  . Gastric ulcer 02/15/2015  . Temporary cerebral vascular dysfunction 02/15/2015  . Chronic kidney disease (CKD), stage III (moderate) ([place]) 09/25/2014  . Benign hypertension 02/20/2014  . 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 CAD in native artery 02/20/2014  . Hypercholesterolemia without hypertriglyceridemia 02/20/2014  . Pure hypercholesterolemia 02/20/2014   Janna Arch, PT, DPT   Janna Arch 10/05/2017, 4:02 PM  [place] MAIN [facility] SERVICES [address] [place], [place], [address] Phone: [phone]   Fax:  [phone]  Name: Cody Blackburn MRN: [id number] Date of Birth: 11-07-1923

## 2017-10-10 ENCOUNTER — Ambulatory Visit: Payer: Medicare Other

## 2017-10-11 ENCOUNTER — Ambulatory Visit: Payer: Medicare Other | Admitting: Family Medicine

## 2017-10-17 ENCOUNTER — Other Ambulatory Visit: Payer: Self-pay | Admitting: Family Medicine

## 2017-10-17 ENCOUNTER — Ambulatory Visit: Payer: Medicare Other

## 2017-10-18 ENCOUNTER — Other Ambulatory Visit: Payer: Self-pay

## 2017-10-18 ENCOUNTER — Ambulatory Visit (INDEPENDENT_AMBULATORY_CARE_PROVIDER_SITE_OTHER): Payer: Medicare Other | Admitting: Family Medicine

## 2017-10-18 ENCOUNTER — Encounter: Payer: Self-pay | Admitting: Family Medicine

## 2017-10-18 ENCOUNTER — Other Ambulatory Visit: Payer: Self-pay | Admitting: Family Medicine

## 2017-10-18 VITALS — BP 132/58 | HR 64 | Temp 98.2°F | Wt 188.2 lb

## 2017-10-18 DIAGNOSIS — E041 Nontoxic single thyroid nodule: Secondary | ICD-10-CM | POA: Diagnosis not present

## 2017-10-18 DIAGNOSIS — I739 Peripheral vascular disease, unspecified: Secondary | ICD-10-CM

## 2017-10-18 DIAGNOSIS — R748 Abnormal levels of other serum enzymes: Secondary | ICD-10-CM

## 2017-10-18 DIAGNOSIS — E039 Hypothyroidism, unspecified: Secondary | ICD-10-CM | POA: Diagnosis not present

## 2017-10-18 DIAGNOSIS — I10 Essential (primary) hypertension: Secondary | ICD-10-CM | POA: Diagnosis not present

## 2017-10-18 DIAGNOSIS — C4492 Squamous cell carcinoma of skin, unspecified: Secondary | ICD-10-CM

## 2017-10-18 DIAGNOSIS — R1013 Epigastric pain: Secondary | ICD-10-CM | POA: Insufficient documentation

## 2017-10-18 DIAGNOSIS — R1310 Dysphagia, unspecified: Secondary | ICD-10-CM | POA: Diagnosis not present

## 2017-10-18 DIAGNOSIS — IMO0002 Reserved for concepts with insufficient information to code with codable children: Secondary | ICD-10-CM

## 2017-10-18 DIAGNOSIS — R5383 Other fatigue: Secondary | ICD-10-CM | POA: Diagnosis not present

## 2017-10-18 LAB — COMPREHENSIVE METABOLIC PANEL
ALBUMIN: 4.3 g/dL (ref 3.5–5.2)
ALK PHOS: 69 U/L (ref 39–117)
ALT: 26 U/L (ref 0–53)
AST: 30 U/L (ref 0–37)
BILIRUBIN TOTAL: 0.4 mg/dL (ref 0.2–1.2)
BUN: 35 mg/dL — ABNORMAL HIGH (ref 6–23)
CO2: 30 mEq/L (ref 19–32)
Calcium: 9.3 mg/dL (ref 8.4–10.5)
Chloride: 103 mEq/L (ref 96–112)
Creatinine, Ser: 1.95 mg/dL — ABNORMAL HIGH (ref 0.40–1.50)
GFR: 34.36 mL/min — AB (ref >60.00)
Glucose, Bld: 159 mg/dL — ABNORMAL HIGH (ref 70–99)
POTASSIUM: 4.2 meq/L (ref 3.5–5.1)
Sodium: 140 mEq/L (ref 135–145)
TOTAL PROTEIN: 6.6 g/dL (ref 6.0–8.3)

## 2017-10-18 LAB — CBC
HCT: 36.8 % — ABNORMAL LOW (ref 39.0–52.0)
HEMOGLOBIN: 12.5 g/dL — AB (ref 13.0–17.0)
MCHC: 34 g/dL (ref 30.0–36.0)
MCV: 91.5 fl (ref 78.0–100.0)
PLATELETS: 205 10*3/uL (ref 150.0–400.0)
RBC: 4.02 Mil/uL — ABNORMAL LOW (ref 4.22–5.81)
RDW: 14.3 % (ref 11.5–15.5)
WBC: 6 10*3/uL (ref 4.0–10.5)

## 2017-10-18 LAB — LIPASE: Lipase: 64 U/L — ABNORMAL HIGH (ref 11.0–59.0)

## 2017-10-18 MED ORDER — OMEPRAZOLE 40 MG PO CPDR: 40.0000 mg | DELAYED_RELEASE_CAPSULE | Freq: Two times a day (BID) | ORAL | 1 refills | Status: DC

## 2017-10-18 NOTE — Assessment & Plan Note (Signed)
Stable.  He has undergone quite an evaluation for this.  Mostly bothers his legs.  No specific cause has been found though he does appear to have peripheral vascular disease.  We will request vascular surgery records.

## 2017-10-18 NOTE — Patient Instructions (Addendum)
Nice to see you. We will get you set up with GI. We will get you an ultrasound of your thyroid to evaluate for the possible nodule. Please take the omeprazole twice daily for the next 2 weeks.  Then go back to once daily.  We will check lab work and contact you with the results.  If your stomach pain worsens or you start passing blood in your stool please be evaluated immediately

## 2017-10-18 NOTE — Assessment & Plan Note (Signed)
We will request records from his vascular surgeon.

## 2017-10-18 NOTE — Assessment & Plan Note (Signed)
Ultrasound ordered

## 2017-10-18 NOTE — Assessment & Plan Note (Signed)
Suspect related to gastric irritation.  Concern would be for ulcer given history.  He will increase his omeprazole to twice daily.  We will check lab work as outlined below.  Given return precautions.

## 2017-10-18 NOTE — Assessment & Plan Note (Signed)
Continue Synthroid °

## 2017-10-18 NOTE — Assessment & Plan Note (Signed)
Controlled. Continue current regimen. 

## 2017-10-18 NOTE — Assessment & Plan Note (Signed)
Issues with swallowing pills.  He does have reflux.  We will have him increase his omeprazole to twice daily.  Refer to GI.

## 2017-10-18 NOTE — Progress Notes (Signed)
Cody Rumps, MD Phone: [phone]  Cody Blackburn is a 83 y.o. male who presents today for follow-up.  Hypertension: Typically 140s-150s.  Feels better when it is in that range.  Taking amlodipine, carvedilol, HCTZ, losartan.  No chest pain or shortness of breath.  Chronic lower extremity edema which is unchanged.  Wears compression stockings.  Found to have findings of peripheral vascular disease on ABI done through cardiology.  Does have some concerning symptoms for claudication.  Describes leg exhaustion and pain with exertion.  No pain otherwise.  Follows with vascular surgery.  Does not appear to have seen them recently.  Does take pravastatin.  Reports he had orthopedic evaluation for possible neurogenic claudication that was negative.  Hypothyroidism: Taking Synthroid.  Does note some cold intolerance.  No significant weight changes.  No skin changes.  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  Following with dermatology for this.  Possible thyroid nodule noted on the left side on exam.  No prior history of this per patient.  Does feel like the pill stick at times when he swallows.  Has gotten somewhat worse recently.  Has had EGDs in the past though not recently.  Does note intermittent epigastric pain that is been bothering him more recently.  Resolves with Aleve.  Does have a history of gastric ulcers.  Takes omeprazole once daily.  Social History   Tobacco Use  Smoking Status Former Smoker  . Last attempt to quit: 1977  . Years since quitting: 41.9  Smokeless Tobacco Never Used     ROS see history of present illness  Objective  Physical Exam Vitals:   10/18/17 1421  BP: (!) 132/58  Pulse: 64  Temp: 98.2 F (36.8 C)  SpO2: 97%    BP Readings from Last 3 Encounters:  10/18/17 (!) 132/58  08/29/17 (!) 160/61  07/08/17 (!) 188/81   Wt Readings from Last 3 Encounters:  10/18/17 188 lb 3.2 oz (85.4 kg)  07/08/17 190 lb (86.2 kg)  04/20/17 192 lb 3.2  oz (87.2 kg)    Physical Exam  Constitutional: No distress.  Neck: No thyromegaly (possible nodule left lobe) present.  Cardiovascular: Normal rate, regular rhythm and normal heart sounds.  Pulmonary/Chest: Effort normal and breath sounds normal.  Abdominal: Soft. Bowel sounds are normal. He exhibits no distension. There is no tenderness. There is no rebound and no guarding.  Musculoskeletal: He exhibits no edema.  Neurological: He is alert. Gait normal.  Skin: Skin is warm and dry. He is not diaphoretic.     Assessment/Plan: Please see individual problem list.  PVD (peripheral vascular disease) ([place]) We will request records from his vascular surgeon.  Dysphagia Issues with swallowing pills.  He does have reflux.  We will have him increase his omeprazole to twice daily.  Refer to GI.  Thyroid nodule Ultrasound ordered.  Epigastric pain Suspect related to gastric irritation.  Concern would be for ulcer given history.  He will increase his omeprazole to twice daily.  We will check lab work as outlined below.  Given return precautions.  Benign hypertension Controlled.  Continue current regimen.  Adult hypothyroidism Continue Synthroid.  Exhaustion Stable.  He has undergone quite an evaluation for this.  Mostly bothers his legs.  No specific cause has been found though he does appear to have peripheral vascular disease.  We will request vascular surgery records.  Squamous cell carcinoma On left hand.  Removed by dermatology.  Well-healing wound.  He will monitor.   Cody Blackburn  was seen today for follow-up.  Diagnoses and all orders for this visit:  PVD (peripheral vascular disease) ([place]) -     Ambulatory referral to Vascular Surgery  Thyroid nodule -     US THYROID; Future  Dysphagia, unspecified type -     Ambulatory referral to Gastroenterology  Epigastric pain -     Comp Met (CMET) -     CBC -     Lipase  Benign hypertension  Adult  hypothyroidism  Exhaustion  Squamous cell carcinoma  Other orders -     omeprazole (PRILOSEC) 40 MG capsule; Take 1 capsule (40 mg total) by mouth 2 (two) times daily. For 2 weeks then once daily    Orders Placed This Encounter  Procedures  . US THYROID    Standing Status:   Future    Standing Expiration Date:   12/19/2018    Order Specific Question:   Reason for Exam (SYMPTOM  OR DIAGNOSIS REQUIRED)    Answer:   possible thyroid nodule left lobe    Order Specific Question:   Preferred imaging location?    Answer:   [facility]  . Comp Met (CMET)  . CBC  . Lipase  . Ambulatory referral to Gastroenterology    Referral Priority:   Routine    Referral Type:   Consultation    Referral Reason:   Specialty Services Required    Number of Visits Requested:   1  . Ambulatory referral to Vascular Surgery    Referral Priority:   Routine    Referral Type:   Surgical    Referral Reason:   Specialty Services Required    Requested Specialty:   Vascular Surgery    Number of Visits Requested:   1    Meds ordered this encounter  Medications  . omeprazole (PRILOSEC) 40 MG capsule    Sig: Take 1 capsule (40 mg total) by mouth 2 (two) times daily. For 2 weeks then once daily    Dispense:  90 capsule    Refill:  1     Cody Rumps, MD [place]

## 2017-10-18 NOTE — Assessment & Plan Note (Signed)
On left hand.  Removed by dermatology.  Well-healing wound.  He will monitor.

## 2017-10-19 ENCOUNTER — Ambulatory Visit: Payer: Medicare Other

## 2017-10-21 ENCOUNTER — Ambulatory Visit (INDEPENDENT_AMBULATORY_CARE_PROVIDER_SITE_OTHER): Payer: Medicare Other | Admitting: Family Medicine

## 2017-10-21 ENCOUNTER — Other Ambulatory Visit: Payer: Medicare Other

## 2017-10-21 ENCOUNTER — Encounter: Payer: Self-pay | Admitting: Family Medicine

## 2017-10-21 DIAGNOSIS — R748 Abnormal levels of other serum enzymes: Secondary | ICD-10-CM | POA: Diagnosis not present

## 2017-10-21 DIAGNOSIS — R1012 Left upper quadrant pain: Secondary | ICD-10-CM | POA: Diagnosis not present

## 2017-10-21 DIAGNOSIS — G8929 Other chronic pain: Secondary | ICD-10-CM | POA: Diagnosis not present

## 2017-10-21 LAB — LIPASE: LIPASE: 151 U/L — AB (ref 7–60)

## 2017-10-21 NOTE — Progress Notes (Signed)
  Cody Rumps, MD Phone: [phone]  Cody Blackburn is a 83 y.o. male who presents today for same-day visit.  Patient notes chronic intermittent left upper quadrant discomfort that has been going on for 6+ years.  Notes a dull discomfort.  He feels it may be associated with lots of intestinal gas.  He does pass a lot of gas.  Nothing in particular brings it on.  Does not last very long.  Occurs multiple times a day.  Has a daily bowel movement.  This does not alleviate the discomfort.  No nausea, vomiting, or diarrhea.  Patient does report a possible history of aortic aneurysm that he reports vascular surgery has been following.  We have previously requested their most recent note.  Social History   Tobacco Use  Smoking Status Former Smoker  . Last attempt to quit: 1977  . Years since quitting: 41.9  Smokeless Tobacco Never Used     ROS see history of present illness  Objective  Physical Exam Vitals:   10/21/17 1537  BP: (!) 160/68  Pulse: 63  Temp: 98.2 F (36.8 C)  SpO2: 98%    BP Readings from Last 3 Encounters:  10/21/17 (!) 160/68  10/18/17 (!) 132/58  08/29/17 (!) 160/61   Wt Readings from Last 3 Encounters:  10/21/17 191 lb 3.2 oz (86.7 kg)  10/18/17 188 lb 3.2 oz (85.4 kg)  07/08/17 190 lb (86.2 kg)    Physical Exam  Constitutional: No distress.  Cardiovascular: Normal rate, regular rhythm and normal heart sounds.  Pulmonary/Chest: Effort normal and breath sounds normal.  Abdominal: Soft. Bowel sounds are normal. He exhibits no distension and no mass. There is no tenderness. There is no rebound and no guarding.  Neurological: He is alert.  Skin: He is not diaphoretic.     Assessment/Plan: Please see individual problem list.  Chronic LUQ pain Chronic issue.  Possibly related to bowel gas.  No prior imaging.  No other significant symptoms with this.  Benign abdominal exam.  Discussed options of adhering to a fodmap diet versus imaging versus seeing  GI.  Patient wanted to work on the dietary changes.  He did not want to do imaging.  He has a GI appointment next week already.  He will discuss further with them.  Return precautions given.  Check elevated lipase.  We have requested records from his vascular surgeon and will look for those to see when his possible aortic aneurysm was last evaluated.  Cody Blackburn was seen today for abdominal pain.  Diagnoses and all orders for this visit:  Elevated lipase -     Lipase  Chronic LUQ pain    No orders of the defined types were placed in this encounter.   No orders of the defined types were placed in this encounter.    Cody Rumps, MD [place]

## 2017-10-21 NOTE — Assessment & Plan Note (Signed)
Chronic issue.  Possibly related to bowel gas.  No prior imaging.  No other significant symptoms with this.  Benign abdominal exam.  Discussed options of adhering to a fodmap diet versus imaging versus seeing GI.  Patient wanted to work on the dietary changes.  He did not want to do imaging.  He has a GI appointment next week already.  He will discuss further with them.  Return precautions given.

## 2017-10-21 NOTE — Patient Instructions (Signed)
Nice to see you. Please look at the diet and try to eliminate some of the foods that you eat most.  This may help with your gas and discomfort. If you would like to see a GI physician or undergo imaging for this please let us know. If this worsens or you develop new symptoms please be evaluated.

## 2017-10-21 NOTE — Addendum Note (Signed)
Addended by: Leeanne Rio on: 10/21/2017 04:04 PM   Modules accepted: Orders

## 2017-10-22 ENCOUNTER — Telehealth: Payer: Self-pay | Admitting: Family Medicine

## 2017-10-22 DIAGNOSIS — R748 Abnormal levels of other serum enzymes: Secondary | ICD-10-CM

## 2017-10-22 NOTE — Telephone Encounter (Signed)
Spoke with patient regarding lipase.  It has gone up though not to the degree that would be consistent with acute pancreatitis.  He has not had any persistent abdominal pain or severe abdominal pain.  I discussed potential causes with him.  Discussed rechecking it early next week.  I advised that if he were to develop persistent abdominal pain or significant abdominal pain he should be evaluated over the weekend.  We will have somebody contact him when we are back in the office early next week to get him set up for a lab appointment.  He was given return precautions.

## 2017-10-24 ENCOUNTER — Ambulatory Visit: Payer: Medicare Other | Admitting: Family Medicine

## 2017-10-25 ENCOUNTER — Ambulatory Visit: Payer: Medicare Other

## 2017-10-26 ENCOUNTER — Ambulatory Visit: Payer: Medicare Other | Admitting: Gastroenterology

## 2017-10-26 NOTE — Telephone Encounter (Signed)
Noted  

## 2017-10-26 NOTE — Telephone Encounter (Signed)
Patient states he is not able to get out of his yard due to know, he will call back as soon as he is able to, he is not having any symptoms.

## 2017-10-27 ENCOUNTER — Ambulatory Visit
Admission: RE | Admit: 2017-10-27 | Discharge: 2017-10-27 | Disposition: A | Discharge status: Home / Self Care | Payer: Medicare Other | Source: Ambulatory Visit | Attending: Family Medicine | Admitting: Family Medicine

## 2017-10-27 ENCOUNTER — Ambulatory Visit: Payer: Medicare Other

## 2017-10-27 DIAGNOSIS — E041 Nontoxic single thyroid nodule: Secondary | ICD-10-CM | POA: Diagnosis present

## 2017-10-28 ENCOUNTER — Other Ambulatory Visit (INDEPENDENT_AMBULATORY_CARE_PROVIDER_SITE_OTHER): Payer: Medicare Other

## 2017-10-28 ENCOUNTER — Encounter: Payer: Self-pay | Admitting: Urology

## 2017-10-28 ENCOUNTER — Ambulatory Visit (INDEPENDENT_AMBULATORY_CARE_PROVIDER_SITE_OTHER): Payer: Medicare Other | Admitting: Urology

## 2017-10-28 VITALS — BP 143/52 | HR 65 | Ht 72.0 in | Wt 191.0 lb

## 2017-10-28 DIAGNOSIS — N400 Enlarged prostate without lower urinary tract symptoms: Secondary | ICD-10-CM | POA: Diagnosis not present

## 2017-10-28 DIAGNOSIS — N183 Chronic kidney disease, stage 3 unspecified: Secondary | ICD-10-CM

## 2017-10-28 DIAGNOSIS — R3915 Urgency of urination: Secondary | ICD-10-CM | POA: Diagnosis not present

## 2017-10-28 DIAGNOSIS — R748 Abnormal levels of other serum enzymes: Secondary | ICD-10-CM

## 2017-10-28 LAB — BLADDER SCAN AMB NON-IMAGING

## 2017-10-28 LAB — LIPASE: LIPASE: 67 U/L — AB (ref 11.0–59.0)

## 2017-10-28 NOTE — Progress Notes (Signed)
11:18 AM  10/28/17  Cody Blackburn 1923-01-29 [id number]  Referring provider: Dion Body, MD [place] [facility] [place],  [id number]  Chief Complaint  Patient presents with  . Benign Prostatic Hypertrophy    1year    HPI: 83 year old male who presents today for routine annual follow-up.  He has a personal history of BPH and urinary symptoms.  He is currently on Flomax,  saw palmetto, and  finasteride.  He does have baseline renal dysfunction, stage III CKD with him slightly more elevated creatinine as of 418 at 2.2 up from the previous year 1.8.  His most recent PSA was checked in March 2014 which is 3.49.  DRE enlarged 10/2014 50 cc without nodules.    He does have a history of undergoing prostate biopsy in the remote past which per patient report was negative.  Today, he continues to have urinary urgency and leaks a small amount of urine sometimes before he is able to get his pants down.  Because of this, he is now wearing a safety pad.  This is somewhat bothersome to him.  He also knows all the restrooms when driving to [place] to visit his family.  He has minimal obstructive voiding symptoms.  IPSS as below.   Postvoid residual today is 0.  IPSS    Row Name 10/28/17 1000         International Prostate Symptom Score   How often have you had the sensation of not emptying your bladder?  Not at All     How often have you had to urinate less than every two hours?  Not at All     How often have you found you stopped and started again several times when you urinated?  Almost always     How often have you found it difficult to postpone urination?  Almost always     How often have you had a weak urinary stream?  Not at All     How often have you had to strain to start urination?  Not at All     How many times did you typically get up at night to urinate?  3 Times     Total IPSS Score  13       Quality of Life due to urinary symptoms   If you were to spend the rest of your life with your urinary condition just the way it is now how would you feel about that?  Delighted        Score:  1-7 Mild 8-19 Moderate 20-35 Severe   PMH: Past Medical History:  Diagnosis Date  . Anxiety and depression   . BPH (benign prostatic hyperplasia)   . GERD (gastroesophageal reflux disease)   . Hypertension   . Incomplete bladder emptying   . Skin cancer    In remission for several years; bianual dermatologist apponitments  . Sleep apnea   . Stomach ulcer   . TIA (transient ischemic attack)     Surgical History: Past Surgical History:  Procedure Laterality Date  . CARDIAC SURGERY  2005  . CARDIAC SURGERY  1977    Home Medications:  Allergies as of 10/28/2017      Reactions   Baycol  [cerivastatin]    Other reaction(s): Muscle Pain muscle pain   Zocor  [simvastatin]    Other reaction(s): Muscle Pain muscle pain      Medication List        Accurate as  of 10/28/17 11:18 AM. Always use your most recent med list.          amLODipine 5 MG tablet Commonly known as:  NORVASC TAKE ONE TABLET BY MOUTH EVERY DAY   BUFFERIN 325 MG Tabs tablet Generic drug:  aspirin buffered Take 325 mg by mouth daily.   BUFFERIN PO Bufferin  1 daily   Calcipotriene 0.005 % solution   CALCIUM CARBONATE-VITAMIN D3 PO Take by mouth.   carvedilol 3.125 MG tablet Commonly known as:  COREG Take by mouth 2 (two) times daily.   clobetasol 0.05 % external solution Commonly known as:  TEMOVATE   Co Q 10 100 MG Caps Take by mouth 2 (two) times daily.   CONTOUR NEXT TEST test strip Generic drug:  glucose blood TEST ONCE DAILY   FIBER-CAPS PO Take by mouth.   finasteride 5 MG tablet Commonly known as:  PROSCAR Take 1 tablet (5 mg total) by mouth daily.   fluticasone 50 MCG/ACT nasal spray Commonly known as:  FLONASE INSTILL 2 SPRAYS INTO EACH NOSTRIL ONCE DAILY   furosemide 20 MG tablet Commonly known as:  LASIX TAKE  1 TABLET(20 MG) BY MOUTH EVERY DAY   gabapentin 100 MG capsule Commonly known as:  NEURONTIN   hydrochlorothiazide 12.5 MG capsule Commonly known as:  MICROZIDE TAKE 2 CAPSULES BY MOUTH DAILY   ketoconazole 2 % cream Commonly known as:  NIZORAL   lamoTRIgine 200 MG tablet Commonly known as:  LAMICTAL lamotrigine 200 mg tablet   levothyroxine 88 MCG tablet Commonly known as:  SYNTHROID, LEVOTHROID TAKE ONE TABLET BY MOUTH EVERY DAY   loratadine 10 MG tablet Commonly known as:  CLARITIN Take by mouth.   losartan 50 MG tablet Commonly known as:  COZAAR TAKE ONE TABLET BY MOUTH TWICE DAILY   MULTI-VITAMINS Tabs Take by mouth.   MULTIPLE VITAMINS-MINERALS ER PO Take by mouth.   nitroGLYCERIN 0.4 MG SL tablet Commonly known as:  NITROSTAT Place under the tongue.   OMEGA-3 FATTY ACIDS PO Take by mouth 2 (two) times daily.   OMEGA-3 FATTY ACIDS-VITAMIN E PO Take by mouth.   omeprazole 40 MG capsule Commonly known as:  PRILOSEC Take 1 capsule (40 mg total) by mouth 2 (two) times daily. For 2 weeks then once daily   polyethylene glycol powder powder Commonly known as:  GLYCOLAX/MIRALAX Take by mouth.   pravastatin 40 MG tablet Commonly known as:  PRAVACHOL TAKE ONE AND ONE-HALF TABLETS DAILY   Saw Palmetto 1000 MG Caps Take by mouth.   Saw Palmetto Caps Take by mouth 2 (two) times daily.   SAW PALMETTO COMPLEX PO Take by mouth.   tamsulosin 0.4 MG Caps capsule Commonly known as:  FLOMAX TAKE 2 CAPSULES BY MOUTH DAILY 30 MINUTES AFTER THE SAME MEAL EACH DAY   triamcinolone cream 0.1 % Commonly known as:  KENALOG   TURMERIC PO Tumeric 500mg - 2 tablets PO QD   Ubiquinol 100 MG Caps Take by mouth.   vitamin C 100 MG tablet Take 100 mg by mouth daily.       Allergies:  Allergies  Allergen Reactions  . Baycol  [Cerivastatin]     Other reaction(s): Muscle Pain muscle pain  . Zocor  [Simvastatin]     Other reaction(s): Muscle Pain muscle pain      Family History: Family History  Problem Relation Age of Onset  . Cancer Neg Hx        Bladder, [place] and Prostate  . Prostate  cancer Neg Hx     Social History:  reports that he quit smoking about 41 years ago. he has never used smokeless tobacco. He reports that he drinks alcohol. His drug history is not on file.  Physical Exam: BP (!) 143/52   Pulse 65   Ht 6' (1.829 m)   Wt 191 lb (86.6 kg)   BMI 25.90 kg/m   Constitutional:  Alert and oriented, No acute distress.   Elderly, pleasant, ambulating with cane.   HEENT: [place] AT, moist mucus membranes.  Trachea midline, no masses. Cardiovascular: No clubbing, cyanosis, or edema. Respiratory: Normal respiratory effort, no increased work of breathing. GI: Abdomen is soft, nontender, nondistended, no abdominal masses DRE: Not indicated given age Skin: No rashes, bruises or suspicious lesions. Neurologic: Grossly intact, no focal deficits, moving all 4 extremities.  Slow gate.  Psychiatric: Normal mood and affect.   Laboratory Data: Cr as above  PVR: Results for orders placed or performed in visit on 10/28/17  BLADDER SCAN AMB NON-IMAGING  Result Value Ref Range   Scan Result 39ml     Assessment & Plan:      1. BPH with obstruction/lower urinary tract symptoms - continue Flomax and finasteride -Continue saw palmetto -Obstructive voiding symptoms well controlled  2. Incomplete bladder emptying (history )  -PVR excellent today -Given his urinary frequency, very cautious starting OAB, recheck PVR 2 weeks after starting mybetri  3. Chronic kidney disease (CKD), stage III (moderate) -Creatinine slightly worsened from last year, followed by nephrology -Bladder Emptying adequate, not likely contributing factor  4. Urinary urgency/ nocturia -We discussed trial of beta 3 agonist in addition to his outlet medications to help control his urinary urgency given his history of obstruction and incomplete bladder emptying in the  past, will be very -Cautious and check PVR at 2 weeks after showing this medication -He will call us after he completes the 4 weeks of sample medication to see if this is helpful  Return for nurse visit for PVR mid January,  MD in 1 year.  [name], MD  [facility] [address], [place], [place] [address] [phone]

## 2017-11-01 ENCOUNTER — Ambulatory Visit: Payer: Medicare Other

## 2017-11-03 ENCOUNTER — Ambulatory Visit: Payer: Medicare Other

## 2017-11-16 ENCOUNTER — Other Ambulatory Visit: Payer: Medicare Other

## 2017-11-21 ENCOUNTER — Encounter: Payer: Self-pay | Admitting: Gastroenterology

## 2017-11-21 ENCOUNTER — Ambulatory Visit (INDEPENDENT_AMBULATORY_CARE_PROVIDER_SITE_OTHER): Payer: Medicare Other | Admitting: Gastroenterology

## 2017-11-21 VITALS — BP 168/66 | HR 69 | Temp 97.5°F | Ht 73.0 in | Wt 190.4 lb

## 2017-11-21 DIAGNOSIS — R1319 Other dysphagia: Secondary | ICD-10-CM

## 2017-11-21 DIAGNOSIS — R1310 Dysphagia, unspecified: Secondary | ICD-10-CM

## 2017-11-21 NOTE — Progress Notes (Signed)
Jonathon Bellows MD, MRCP(U.K) [address]  Between  [place], [place] [address]  Main: [phone]  Fax: [phone]   Gastroenterology Consultation  Referring Provider:     [name], MD Primary Care Physician:  [name], MD Primary Gastroenterologist:  Dr. Jonathon Bellows  Reason for Consultation:     Dysphagia         HPI:   Cody Blackburn is a 84 y.o. y/o male referred for consultation & management  by Dr. Caryl Bis, Angela Adam, MD.    He was recently seen by [name], MD and they discussed about his abdominal pain of 6 years in duration. A month back his lipase was 2.5 x elevated at 151 and subsequently was improved to 67 . Hb 12.5 grams . Cr 1.95 ,albumin 4.3.   Says he has a lot of mucus in his throat. He says that he takes 16 pills and some are very long , very hard and he has difficulty swallowing them , even the smaller ones get stuck , points to his neck No issues with liquids. Denies any history of regurgitation or coughing while swallowing. This has been ongoing for  2 years, not got any worse.   He also has some abdominal pain, center of the abdomen and sometimes the left side. He recalls prior dilation by Dr Tiffany Kocher in the past for a "ring". He has had abdominal pain for many years. At this time very seldom in pain.   Past Medical History:  Diagnosis Date  . Anxiety and depression   . BPH (benign prostatic hyperplasia)   . GERD (gastroesophageal reflux disease)   . Hypertension   . Incomplete bladder emptying   . Skin cancer    In remission for several years; bianual dermatologist apponitments  . Sleep apnea   . Stomach ulcer   . TIA (transient ischemic attack)     Past Surgical History:  Procedure Laterality Date  . CARDIAC SURGERY  2005  . CARDIAC SURGERY  1977    Prior to Admission medications   Medication Sig Start Date End Date Taking? Authorizing Provider  amLODipine (NORVASC) 5 MG tablet TAKE ONE TABLET BY MOUTH EVERY DAY  09/05/17   [name], MD  Ascorbic Acid (VITAMIN C) 100 MG tablet Take 100 mg by mouth daily.    [provider]  Aspirin Buf,CaCarb-MgCarb-MgO, (BUFFERIN PO) Bufferin  1 daily    [provider]  aspirin buffered (BUFFERIN) 325 MG TABS tablet Take 325 mg by mouth daily.    [provider]  Calcipotriene 0.005 % solution  02/24/17   [provider]  Calcium Carb-Cholecalciferol (CALCIUM CARBONATE-VITAMIN D3 PO) Take by mouth.    [provider]  Calcium Polycarbophil (FIBER-CAPS PO) Take by mouth.    [provider]  carvedilol (COREG) 3.125 MG tablet Take by mouth 2 (two) times daily.     [provider]  clobetasol (TEMOVATE) 0.05 % external solution  02/24/17   [provider]  Coenzyme Q10 (CO Q 10) 100 MG CAPS Take by mouth 2 (two) times daily.     [provider]  CONTOUR NEXT TEST test strip TEST ONCE DAILY 08/22/17   [name], MD  finasteride (PROSCAR) 5 MG tablet Take 1 tablet (5 mg total) by mouth daily. 10/29/16   [name], MD  fluticasone [facility]) 50 MCG/ACT nasal spray INSTILL 2 SPRAYS INTO EACH NOSTRIL ONCE DAILY 09/01/17   [name],  MD  furosemide (LASIX) 20 MG tablet TAKE 1 TABLET(20 MG) BY MOUTH EVERY DAY 12/20/16   [provider]  gabapentin (NEURONTIN) 100 MG capsule  09/01/17   [provider]  hydrochlorothiazide (MICROZIDE) 12.5 MG capsule TAKE 2 CAPSULES BY MOUTH DAILY 09/01/17   [name], MD  ketoconazole (NIZORAL) 2 % cream  08/22/17   [provider]  lamoTRIgine (LAMICTAL) 200 MG tablet lamotrigine 200 mg tablet    [provider]  levothyroxine (SYNTHROID, LEVOTHROID) 88 MCG tablet TAKE ONE TABLET BY MOUTH EVERY DAY 06/24/17   [name], MD  loratadine (CLARITIN) 10 MG tablet Take by mouth.    [provider]  losartan (COZAAR) 50 MG tablet TAKE ONE TABLET BY MOUTH TWICE DAILY 09/19/17    [name], MD  Misc Natural Products (SAW PALMETTO) CAPS Take by mouth 2 (two) times daily.     [provider]  Multiple Vitamin (MULTI-VITAMINS) TABS Take by mouth.    [provider]  MULTIPLE VITAMINS-MINERALS ER PO Take by mouth.    [provider]  nitroGLYCERIN (NITROSTAT) 0.4 MG SL tablet Place under the tongue.    [provider]  OMEGA-3 FATTY ACIDS PO Take by mouth 2 (two) times daily.     [provider]  OMEGA-3 FATTY ACIDS-VITAMIN E PO Take by mouth.    [provider]  omeprazole (PRILOSEC) 40 MG capsule Take 1 capsule (40 mg total) by mouth 2 (two) times daily. For 2 weeks then once daily 10/18/17   [name], MD  polyethylene glycol powder [facility]) powder Take by mouth.    [provider]  pravastatin (PRAVACHOL) 40 MG tablet TAKE ONE AND ONE-HALF TABLETS DAILY 09/26/17   [name], MD  Saw Palmetto 1000 MG CAPS Take by mouth.    [provider]  tamsulosin (FLOMAX) 0.4 MG CAPS capsule TAKE 2 CAPSULES BY MOUTH DAILY 30 MINUTES AFTER THE SAME MEAL EACH DAY 10/18/17   [name], MD  triamcinolone cream (KENALOG) 0.1 %  05/10/15   [provider]  TURMERIC PO Tumeric 500mg - 2 tablets PO QD    [provider]  Ubiquinol 100 MG CAPS Take by mouth.    [provider]  Zn-Pyg Afri-Nettle-Saw Palmet (SAW PALMETTO COMPLEX PO) Take by mouth.    [provider]    Family History  Problem Relation Age of Onset  . Cancer Neg Hx        Bladder, Kideny and Prostate  . Prostate cancer Neg Hx      Social History   Tobacco Use  . Smoking status: Former Smoker    Last attempt to quit: 1977    Years since quitting: 42.0  . Smokeless tobacco: Never Used  Substance Use Topics  . Alcohol use: Yes    Comment: occasional  . Drug use: Not on file    Allergies as of 11/21/2017 - Review Complete 10/28/2017  Allergen Reaction Noted  .  Baycol  [cerivastatin]  02/15/2015  . Zocor  [simvastatin]  02/15/2015    Review of Systems:    All systems reviewed and negative except where noted in HPI.   Physical Exam:  There were no vitals taken for this visit. No LMP for male patient. Psych:  Alert and cooperative. Normal mood and affect. General:   Alert,  Well-developed, well-nourished, pleasant and cooperative in NAD Head:  Normocephalic and atraumatic. Eyes:  Sclera clear, no icterus.   Conjunctiva  pink. Ears:  Normal auditory acuity. Nose:  No deformity, discharge, or lesions. Mouth:  No deformity or lesions,oropharynx pink & moist. Neck:  Supple; no masses or thyromegaly. Lungs:  Respirations even and unlabored.  Clear throughout to auscultation.   No wheezes, crackles, or rhonchi. No acute distress. Heart:  Regular rate and rhythm; no murmurs, clicks, rubs, or gallops. Abdomen:  Normal bowel sounds.  No bruits.  Soft, non-tender and non-distended without masses, hepatosplenomegaly or hernias noted.  No guarding or rebound tenderness.    Msk:  Symmetrical without gross deformities. Good, equal movement & strength bilaterally. Pulses:  Normal pulses noted. Extremities:  No clubbing or edema.  No cyanosis. Neurologic:  Alert and oriented x3;  grossly normal neurologically. Skin:  Intact without significant lesions or rashes. No jaundice. Lymph Nodes:  No significant cervical adenopathy. Psych:  Alert and cooperative. Normal mood and affect.  Imaging Studies: US Thyroid  Result Date: 10/27/2017 CLINICAL DATA:  Nodule on physical exam EXAM: THYROID ULTRASOUND TECHNIQUE: Ultrasound examination of the thyroid gland and adjacent soft tissues was performed. COMPARISON:  None. FINDINGS: Parenchymal Echotexture: Mildly heterogenous Isthmus: 0.2 cm thickness Right lobe: 3.4 x 1.8 x 1.4 cm Left lobe: 3.3 x 1 x 1.2 cm _________________________________________________________ Estimated total number of nodules >/= 1 cm: 0 Number of  spongiform nodules >/=  2 cm not described below (TR1): 0 Number of mixed cystic and solid nodules >/= 1.5 cm not described below (TR2): 0 _________________________________________________________ No discrete nodules are seen within the thyroid gland. IMPRESSION: Negative. The above is in keeping with the ACR TI-RADS recommendations - J Am Coll Radiol 2017;14:587-595. Electronically Signed   By: Lucrezia Europe M.D.   On: 10/27/2017 15:01    Assessment and Plan:   Cody Blackburn is a 84 y.o. y/o male has been referred for dysphagia.He has a 6 year history of abdominal pain as well. Recently Lipase was checked and elevation was noted which  is non specific and causes can range from tumour's to pancreatitis or from renal failure or from drugs. It can also be caused by peptic ulcers. The test by itself has no utility and has to be interpreted in context of the clinical picture. In terms of his dysphagia will proceed with an EGD and dilation if there is a stricture. In terms of his elevated Lipase and chronic abdominal pain , would avoid any tests since it has been stable. If concern persists then the next step would be a CT scan without contrast due to poor renal function.    I have discussed alternative options, risks & benefits,  which include, but are not limited to, bleeding, infection, perforation,respiratory complication & drug reaction.  The patient agrees with this plan & written consent will be obtained.      Follow up PRN  Dr Jonathon Bellows MD,MRCP(U.K)

## 2017-11-22 ENCOUNTER — Encounter
Admission: RE | Disposition: A | Discharge status: Home / Self Care | Payer: Self-pay | Source: Ambulatory Visit | Attending: Gastroenterology

## 2017-11-22 ENCOUNTER — Other Ambulatory Visit: Payer: Self-pay

## 2017-11-22 ENCOUNTER — Encounter: Payer: Self-pay | Admitting: *Deleted

## 2017-11-22 ENCOUNTER — Ambulatory Visit: Payer: Medicare Other | Admitting: Certified Registered"

## 2017-11-22 ENCOUNTER — Ambulatory Visit
Admission: RE | Admit: 2017-11-22 | Discharge: 2017-11-22 | Disposition: A | Discharge status: Home / Self Care | Payer: Medicare Other | Source: Ambulatory Visit | Attending: Gastroenterology | Admitting: Gastroenterology

## 2017-11-22 DIAGNOSIS — K219 Gastro-esophageal reflux disease without esophagitis: Secondary | ICD-10-CM | POA: Diagnosis not present

## 2017-11-22 DIAGNOSIS — F419 Anxiety disorder, unspecified: Secondary | ICD-10-CM | POA: Diagnosis not present

## 2017-11-22 DIAGNOSIS — Z951 Presence of aortocoronary bypass graft: Secondary | ICD-10-CM | POA: Diagnosis not present

## 2017-11-22 DIAGNOSIS — Z85828 Personal history of other malignant neoplasm of skin: Secondary | ICD-10-CM | POA: Insufficient documentation

## 2017-11-22 DIAGNOSIS — G4730 Sleep apnea, unspecified: Secondary | ICD-10-CM | POA: Diagnosis not present

## 2017-11-22 DIAGNOSIS — Z8673 Personal history of transient ischemic attack (TIA), and cerebral infarction without residual deficits: Secondary | ICD-10-CM | POA: Diagnosis not present

## 2017-11-22 DIAGNOSIS — Z79899 Other long term (current) drug therapy: Secondary | ICD-10-CM | POA: Diagnosis not present

## 2017-11-22 DIAGNOSIS — R1310 Dysphagia, unspecified: Secondary | ICD-10-CM | POA: Insufficient documentation

## 2017-11-22 DIAGNOSIS — Z8719 Personal history of other diseases of the digestive system: Secondary | ICD-10-CM | POA: Insufficient documentation

## 2017-11-22 DIAGNOSIS — N400 Enlarged prostate without lower urinary tract symptoms: Secondary | ICD-10-CM | POA: Insufficient documentation

## 2017-11-22 DIAGNOSIS — Z888 Allergy status to other drugs, medicaments and biological substances status: Secondary | ICD-10-CM | POA: Insufficient documentation

## 2017-11-22 DIAGNOSIS — I2510 Atherosclerotic heart disease of native coronary artery without angina pectoris: Secondary | ICD-10-CM | POA: Diagnosis not present

## 2017-11-22 DIAGNOSIS — Z87891 Personal history of nicotine dependence: Secondary | ICD-10-CM | POA: Diagnosis not present

## 2017-11-22 DIAGNOSIS — F329 Major depressive disorder, single episode, unspecified: Secondary | ICD-10-CM | POA: Diagnosis not present

## 2017-11-22 DIAGNOSIS — I10 Essential (primary) hypertension: Secondary | ICD-10-CM | POA: Diagnosis not present

## 2017-11-22 DIAGNOSIS — I739 Peripheral vascular disease, unspecified: Secondary | ICD-10-CM | POA: Diagnosis not present

## 2017-11-22 DIAGNOSIS — E039 Hypothyroidism, unspecified: Secondary | ICD-10-CM | POA: Insufficient documentation

## 2017-11-22 HISTORY — PX: ESOPHAGOGASTRODUODENOSCOPY (EGD) WITH PROPOFOL: SHX5813

## 2017-11-22 SURGERY — ESOPHAGOGASTRODUODENOSCOPY (EGD) WITH PROPOFOL
Anesthesia: General

## 2017-11-22 MED ORDER — PROPOFOL 500 MG/50ML IV EMUL
INTRAVENOUS | Status: DC | PRN
  Administered 2017-11-22: 120 ug/kg/min via INTRAVENOUS

## 2017-11-22 MED ORDER — PROPOFOL 10 MG/ML IV BOLUS
INTRAVENOUS | Status: DC | PRN
  Administered 2017-11-22 (×2): 50 mg via INTRAVENOUS

## 2017-11-22 MED ORDER — LIDOCAINE 2% (20 MG/ML) 5 ML SYRINGE
INTRAMUSCULAR | Status: DC | PRN
  Administered 2017-11-22: 25 mg via INTRAVENOUS

## 2017-11-22 MED ORDER — SODIUM CHLORIDE 0.9 % IV SOLN
INTRAVENOUS | Status: DC
  Administered 2017-11-22: 1000 mL via INTRAVENOUS

## 2017-11-22 NOTE — Anesthesia Preprocedure Evaluation (Signed)
Anesthesia Evaluation  Patient identified by MRN, date of birth, ID band Patient awake    Reviewed: Allergy & Precautions, NPO status , reviewed documented beta blocker date and time   History of Anesthesia Complications Negative for: history of anesthetic complications  Airway Mallampati: II       Dental   Pulmonary sleep apnea , former smoker,           Cardiovascular hypertension, Pt. on medications and Pt. on home beta blockers + CAD, + CABG and + Peripheral Vascular Disease       Neuro/Psych Anxiety Depression TIA   GI/Hepatic PUD, GERD  Medicated,  Endo/Other  Hypothyroidism   Renal/GU Renal InsufficiencyRenal disease     Musculoskeletal   Abdominal   Peds  Hematology  (+) anemia ,   Anesthesia Other Findings   Reproductive/Obstetrics                             Anesthesia Physical Anesthesia Plan  ASA: III  Anesthesia Plan: General   Post-op Pain Management:    Induction: Intravenous  PONV Risk Score and Plan: 2 and TIVA and Propofol infusion  Airway Management Planned: Nasal Cannula  Additional Equipment:   Intra-op Plan:   Post-operative Plan:   Informed Consent: I have reviewed the patients History and Physical, chart, labs and discussed the procedure including the risks, benefits and alternatives for the proposed anesthesia with the patient or authorized representative who has indicated his/her understanding and acceptance.     Plan Discussed with:   Anesthesia Plan Comments:         Anesthesia Quick Evaluation

## 2017-11-22 NOTE — Anesthesia Postprocedure Evaluation (Signed)
Anesthesia Post Note  Patient: Cody Blackburn  Procedure(s) Performed: ESOPHAGOGASTRODUODENOSCOPY (EGD) WITH PROPOFOL (N/A )  Patient location during evaluation: Endoscopy Anesthesia Type: General Level of consciousness: awake and alert Pain management: pain level controlled Vital Signs Assessment: post-procedure vital signs reviewed and stable Respiratory status: spontaneous breathing and respiratory function stable Cardiovascular status: stable Anesthetic complications: no     Last Vitals:  Vitals:   11/22/17 0848 11/22/17 1010  BP: 126/63 (!) 100/47  Pulse: 68 60  Resp: 20 18  Temp: (!) 35.6 C (!) 36.1 C  SpO2: 99% 100%    Last Pain:  Vitals:   11/22/17 1010  TempSrc: Tympanic                 [name] K

## 2017-11-22 NOTE — Transfer of Care (Signed)
Immediate Anesthesia Transfer of Care Note  Patient: Cody Blackburn  Procedure(s) Performed: ESOPHAGOGASTRODUODENOSCOPY (EGD) WITH PROPOFOL (N/A )  Patient Location: Endoscopy Unit  Anesthesia Type:General  Level of Consciousness: awake and alert   Airway & Oxygen Therapy: Patient Spontanous Breathing and Patient connected to nasal cannula oxygen  Post-op Assessment: Report given to RN and Post -op Vital signs reviewed and stable  Post vital signs: Reviewed  Last Vitals:  Vitals:   11/22/17 0848 11/22/17 1010  BP: 126/63 (!) 100/47  Pulse: 68 62  Resp: 20 18  Temp: (!) 35.6 C (!) 36.2 C  SpO2: 99% 100%    Last Pain:  Vitals:   11/22/17 0848  TempSrc: Tympanic         Complications: No apparent anesthesia complications

## 2017-11-22 NOTE — Op Note (Signed)
[facility] Gastroenterology Patient Name: Cody Blackburn Procedure Date: 11/22/2017 9:43 AM MRN: [id number] Account #: [id number] Date of Birth: 1923/03/09 Admit Type: Outpatient Age: 84 Room: [facility] ENDO ROOM 1 Gender: Male Note Status: Finalized Procedure:            Upper GI endoscopy Indications:          Dysphagia Providers:            Jonathon Bellows MD, MD Referring MD:         Angela Adam. Caryl Bis (Referring MD) Medicines:            Monitored Anesthesia Care Complications:        No immediate complications. Procedure:            Pre-Anesthesia Assessment:                       - Prior to the procedure, a History and Physical was                        performed, and patient medications, allergies and                        sensitivities were reviewed. The patient's tolerance of                        previous anesthesia was reviewed.                       - The risks and benefits of the procedure and the                        sedation options and risks were discussed with the                        patient. All questions were answered and informed                        consent was obtained.                       - ASA Grade Assessment: III - A patient with severe                        systemic disease.                       After obtaining informed consent, the endoscope was                        passed under direct vision. Throughout the procedure,                        the patient's blood pressure, pulse, and oxygen                        saturations were monitored continuously. The Endoscope                        was introduced through the mouth, and advanced to the  third part of duodenum. The upper GI endoscopy was                        accomplished with ease. The patient tolerated the                        procedure well. Findings:      The examined duodenum was normal.      The stomach was normal.      The examined esophagus  was normal. Biopsies were taken with a cold       forceps for histology. A TTS dilator was passed through the scope.       Dilation with a 15-16.5-18 mm balloon dilator was performed to 18 mm.       dilation was performed empirically at the GE junction and lower third       part of the esophagus. Impression:           - Normal examined duodenum.                       - Normal stomach.                       - Normal esophagus. Biopsied. Dilated. Recommendation:       - Discharge patient to home (with escort).                       - Resume previous diet.                       - Continue present medications.                       - Await pathology results. Procedure Code(s):    --- Professional ---                       [phone], Esophagogastroduodenoscopy, flexible, transoral;                        with transendoscopic balloon dilation of esophagus                        (less than 30 mm diameter)                       43239, Esophagogastroduodenoscopy, flexible, transoral;                        with biopsy, single or multiple Diagnosis Code(s):    --- Professional ---                       R13.10, Dysphagia, unspecified CPT copyright 2016 American Medical Association. All rights reserved. The codes documented in this report are preliminary and upon coder review may  be revised to meet current compliance requirements. Jonathon Bellows, MD Jonathon Bellows MD, MD 11/22/2017 10:05:41 AM This report has been signed electronically. Number of Addenda: 0 Note Initiated On: 11/22/2017 9:43 AM      [facility]

## 2017-11-22 NOTE — Anesthesia Post-op Follow-up Note (Signed)
Anesthesia QCDR form completed.        

## 2017-11-22 NOTE — H&P (Signed)
Jonathon Bellows, MD [address], [place], [place], [place], [address] [address], [place], [place], [place], [address] Phone: [phone]  Fax: [phone]  Primary Care Physician:  [name], MD   Pre-Procedure History & Physical: HPI:  Cody Blackburn is a 84 y.o. male is here for an endoscopy    Past Medical History:  Diagnosis Date  . Anxiety and depression   . BPH (benign prostatic hyperplasia)   . GERD (gastroesophageal reflux disease)   . Hypertension   . Incomplete bladder emptying   . Skin cancer    In remission for several years; bianual dermatologist apponitments  . Sleep apnea   . Stomach ulcer   . TIA (transient ischemic attack)     Past Surgical History:  Procedure Laterality Date  . CARDIAC SURGERY  2005  . CARDIAC SURGERY  1977    Prior to Admission medications   Medication Sig Start Date End Date Taking? Authorizing Provider  amLODipine (NORVASC) 5 MG tablet TAKE ONE TABLET BY MOUTH EVERY DAY 09/05/17  Yes [name], MD  carvedilol (COREG) 3.125 MG tablet Take by mouth 2 (two) times daily.    Yes [provider]  clobetasol (TEMOVATE) 0.05 % external solution  02/24/17  Yes [provider]  finasteride (PROSCAR) 5 MG tablet Take 1 tablet (5 mg total) by mouth daily. 10/29/16  Yes [name], MD  furosemide (LASIX) 20 MG tablet TAKE 1 TABLET(20 MG) BY MOUTH EVERY DAY 12/20/16  Yes [provider]  gabapentin (NEURONTIN) 100 MG capsule  09/01/17  Yes [provider]  hydrochlorothiazide (MICROZIDE) 12.5 MG capsule TAKE 2 CAPSULES BY MOUTH DAILY 09/01/17  Yes [name], MD  lamoTRIgine (LAMICTAL) 200 MG tablet lamotrigine 200 mg tablet   Yes [provider]  levothyroxine (SYNTHROID, LEVOTHROID) 88 MCG tablet TAKE ONE TABLET BY MOUTH EVERY DAY 06/24/17  Yes [name], MD  losartan (COZAAR) 50 MG tablet TAKE ONE TABLET BY MOUTH TWICE DAILY 09/19/17  Yes [name],  MD  omeprazole (PRILOSEC) 40 MG capsule Take 1 capsule (40 mg total) by mouth 2 (two) times daily. For 2 weeks then once daily 10/18/17  Yes [name], MD  pravastatin (PRAVACHOL) 40 MG tablet TAKE ONE AND ONE-HALF TABLETS DAILY 09/26/17  Yes [name], MD  Ascorbic Acid (VITAMIN C) 100 MG tablet Take 100 mg by mouth daily.    [provider]  Aspirin Buf,CaCarb-MgCarb-MgO, (BUFFERIN PO) Bufferin  1 daily    [provider]  aspirin buffered (BUFFERIN) 325 MG TABS tablet Take 325 mg by mouth daily.    [provider]  Calcipotriene 0.005 % solution  02/24/17   [provider]  Calcium Carb-Cholecalciferol (CALCIUM CARBONATE-VITAMIN D3 PO) Take by mouth.    [provider]  Calcium Polycarbophil (FIBER-CAPS PO) Take by mouth.    [provider]  Coenzyme Q10 (CO Q 10) 100 MG CAPS Take by mouth 2 (two) times daily.     [provider]  CONTOUR NEXT TEST test strip TEST ONCE DAILY Patient not taking: Reported on 11/21/2017 08/22/17   [name], MD  fluticasone [facility]) 50 MCG/ACT nasal spray INSTILL 2 SPRAYS INTO EACH NOSTRIL ONCE DAILY 09/01/17   [name], MD  ketoconazole (NIZORAL) 2 % cream  08/22/17   [provider]  loratadine (CLARITIN) 10 MG tablet Take by mouth.    [provider]  Misc Natural Products (  SAW PALMETTO) CAPS Take by mouth 2 (two) times daily.     [provider]  Multiple Vitamin (MULTI-VITAMINS) TABS Take by mouth.    [provider]  MULTIPLE VITAMINS-MINERALS ER PO Take by mouth.    [provider]  nitroGLYCERIN (NITROSTAT) 0.4 MG SL tablet Place under the tongue.    [provider]  OMEGA-3 FATTY ACIDS PO Take by mouth 2 (two) times daily.     [provider]  OMEGA-3 FATTY ACIDS-VITAMIN E PO Take by mouth.    [provider]  polyethylene glycol powder (GLYCOLAX/MIRALAX) powder Take by mouth.     [provider]  Saw Palmetto 1000 MG CAPS Take by mouth.    [provider]  tamsulosin (FLOMAX) 0.4 MG CAPS capsule TAKE 2 CAPSULES BY MOUTH DAILY 30 MINUTES AFTER THE SAME MEAL EACH DAY 10/18/17   [name], MD  triamcinolone cream (KENALOG) 0.1 %  05/10/15   [provider]  TURMERIC PO Tumeric 500mg - 2 tablets PO QD    [provider]  Ubiquinol 100 MG CAPS Take by mouth.    [provider]  Zn-Pyg Afri-Nettle-Saw Palmet (SAW PALMETTO COMPLEX PO) Take by mouth.    [provider]    Allergies as of 11/21/2017 - Review Complete 11/21/2017  Allergen Reaction Noted  . Baycol  [cerivastatin]  02/15/2015  . Zocor  [simvastatin]  02/15/2015    Family History  Problem Relation Age of Onset  . Cancer Neg Hx        Bladder, Kideny and Prostate  . Prostate cancer Neg Hx     Social History   Socioeconomic History  . Marital status: Married    Spouse name: Not on file  . Number of children: Not on file  . Years of education: Not on file  . Highest education level: Not on file  Social Needs  . Financial resource strain: Not on file  . Food insecurity - worry: Not on file  . Food insecurity - inability: Not on file  . Transportation needs - medical: Not on file  . Transportation needs - non-medical: Not on file  Occupational History  . Not on file  Tobacco Use  . Smoking status: Former Smoker    Last attempt to quit: 1977    Years since quitting: 42.0  . Smokeless tobacco: Never Used  Substance and Sexual Activity  . Alcohol use: No    Frequency: Never  . Drug use: No  . Sexual activity: No  Other Topics Concern  . Not on file  Social History Narrative  . Not on file    Review of Systems: See HPI, otherwise negative ROS  Physical Exam: BP 126/63   Pulse 68   Temp (!) 96.1 F (35.6 C) (Tympanic)   Resp 20   Ht 6\' 1"  (1.854 m)   Wt 190 lb (86.2 kg)   SpO2 99%   BMI 25.07 kg/m  General:   Alert,   pleasant and cooperative in NAD Head:  Normocephalic and atraumatic. Neck:  Supple; no masses or thyromegaly. Lungs:  Clear throughout to auscultation, normal respiratory effort.    Heart:  +S1, +S2, Regular rate and rhythm, No edema. Abdomen:  Soft, nontender and nondistended. Normal bowel sounds, without guarding, and without rebound.   Neurologic:  Alert and  oriented x4;  grossly normal neurologically.  Impression/Plan: Cody Blackburn is here for an endoscopy  to be performed for  evaluation of dysphagia  Risks, benefits, limitations, and alternatives regarding endoscopy and dilation have been reviewed with the patient.  Questions have been answered.  All parties agreeable.   Jonathon Bellows, MD  11/22/2017, 9:06 AM

## 2017-11-23 ENCOUNTER — Encounter: Payer: Self-pay | Admitting: Gastroenterology

## 2017-11-23 LAB — SURGICAL PATHOLOGY

## 2017-11-24 ENCOUNTER — Other Ambulatory Visit: Payer: Self-pay | Admitting: Urology

## 2017-11-28 ENCOUNTER — Encounter: Payer: Self-pay | Admitting: Gastroenterology

## 2017-11-28 ENCOUNTER — Telehealth: Payer: Self-pay

## 2017-11-28 NOTE — Telephone Encounter (Signed)
Advised patient of results per Dr. Vicente Males.   Inform normal biopsies-enquire how his swallowing is after dilation.     - Patient states it's no different. He has to drink after eating soft foods to force  - Patient having a dull ache pain below stomach.   Advised patient to callback if the pain is prolonged.

## 2017-11-29 ENCOUNTER — Other Ambulatory Visit: Payer: Self-pay | Admitting: Family Medicine

## 2017-11-30 ENCOUNTER — Ambulatory Visit (INDEPENDENT_AMBULATORY_CARE_PROVIDER_SITE_OTHER): Payer: Medicare Other | Admitting: Family Medicine

## 2017-11-30 VITALS — BP 163/65 | HR 63 | Ht 73.0 in | Wt 189.3 lb

## 2017-11-30 DIAGNOSIS — N400 Enlarged prostate without lower urinary tract symptoms: Secondary | ICD-10-CM | POA: Diagnosis not present

## 2017-11-30 LAB — BLADDER SCAN AMB NON-IMAGING: Scan Result: 103

## 2017-11-30 MED ORDER — MIRABEGRON ER 25 MG PO TB24: 25.0000 mg | ORAL_TABLET | Freq: Every day | ORAL | 11 refills | Status: DC

## 2017-11-30 NOTE — Telephone Encounter (Signed)
Last OV 10/21/17 filed under historical

## 2017-11-30 NOTE — Telephone Encounter (Signed)
Refill given

## 2017-12-21 ENCOUNTER — Other Ambulatory Visit: Payer: Self-pay | Admitting: Family Medicine

## 2017-12-21 ENCOUNTER — Telehealth: Payer: Self-pay | Admitting: Urology

## 2017-12-21 NOTE — Telephone Encounter (Signed)
Pt called in and said he couldn't afford Myrbetriq, but he called his insurance company and they recommended the following alternatives:  Oxybutynin, Tolterodine, Trospium, Toviaz.  He wants to know if any of these would be O.K. For him to try and if so, could he get an Rx?  Please call pt

## 2017-12-23 NOTE — Telephone Encounter (Signed)
Trospium would be the only on the list that I would remotely consider.  All the other ones have significant cognitive side effects in elderly patients.   [name], MD

## 2017-12-27 MED ORDER — TROSPIUM CHLORIDE 20 MG PO TABS: 20.0000 mg | ORAL_TABLET | Freq: Two times a day (BID) | ORAL | 0 refills | Status: DC

## 2017-12-27 NOTE — Telephone Encounter (Signed)
Medication was sent to pharmacy.

## 2018-01-05 LAB — HM DIABETES EYE EXAM

## 2018-01-06 ENCOUNTER — Encounter: Payer: Self-pay | Admitting: Family Medicine

## 2018-01-12 ENCOUNTER — Other Ambulatory Visit: Payer: Self-pay

## 2018-01-12 ENCOUNTER — Encounter: Payer: Self-pay | Admitting: Emergency Medicine

## 2018-01-12 ENCOUNTER — Emergency Department: Payer: No Typology Code available for payment source

## 2018-01-12 ENCOUNTER — Emergency Department
Admission: EM | Admit: 2018-01-12 | Discharge: 2018-01-12 | Disposition: A | Discharge status: Other Acute Inpatient Hospital | Payer: No Typology Code available for payment source | Attending: Emergency Medicine | Admitting: Emergency Medicine

## 2018-01-12 DIAGNOSIS — S29001A Unspecified injury of muscle and tendon of front wall of thorax, initial encounter: Secondary | ICD-10-CM | POA: Insufficient documentation

## 2018-01-12 DIAGNOSIS — Y92410 Unspecified street and highway as the place of occurrence of the external cause: Secondary | ICD-10-CM | POA: Diagnosis not present

## 2018-01-12 DIAGNOSIS — Z23 Encounter for immunization: Secondary | ICD-10-CM | POA: Insufficient documentation

## 2018-01-12 DIAGNOSIS — I129 Hypertensive chronic kidney disease with stage 1 through stage 4 chronic kidney disease, or unspecified chronic kidney disease: Secondary | ICD-10-CM | POA: Diagnosis not present

## 2018-01-12 DIAGNOSIS — S22080A Wedge compression fracture of T11-T12 vertebra, initial encounter for closed fracture: Secondary | ICD-10-CM | POA: Insufficient documentation

## 2018-01-12 DIAGNOSIS — N183 Chronic kidney disease, stage 3 (moderate): Secondary | ICD-10-CM | POA: Insufficient documentation

## 2018-01-12 DIAGNOSIS — R1012 Left upper quadrant pain: Secondary | ICD-10-CM | POA: Insufficient documentation

## 2018-01-12 DIAGNOSIS — G8911 Acute pain due to trauma: Secondary | ICD-10-CM | POA: Diagnosis not present

## 2018-01-12 DIAGNOSIS — Y999 Unspecified external cause status: Secondary | ICD-10-CM | POA: Insufficient documentation

## 2018-01-12 DIAGNOSIS — Y9389 Activity, other specified: Secondary | ICD-10-CM | POA: Diagnosis not present

## 2018-01-12 DIAGNOSIS — S0990XA Unspecified injury of head, initial encounter: Secondary | ICD-10-CM | POA: Diagnosis present

## 2018-01-12 DIAGNOSIS — Z87891 Personal history of nicotine dependence: Secondary | ICD-10-CM | POA: Diagnosis not present

## 2018-01-12 DIAGNOSIS — E039 Hypothyroidism, unspecified: Secondary | ICD-10-CM | POA: Insufficient documentation

## 2018-01-12 DIAGNOSIS — S066X0A Traumatic subarachnoid hemorrhage without loss of consciousness, initial encounter: Secondary | ICD-10-CM | POA: Diagnosis not present

## 2018-01-12 DIAGNOSIS — R52 Pain, unspecified: Secondary | ICD-10-CM

## 2018-01-12 DIAGNOSIS — I609 Nontraumatic subarachnoid hemorrhage, unspecified: Secondary | ICD-10-CM

## 2018-01-12 LAB — CBC WITH DIFFERENTIAL/PLATELET
BASOS ABS: 0 10*3/uL (ref 0–0.1)
BASOS PCT: 1 %
EOS ABS: 0.4 10*3/uL (ref 0–0.7)
Eosinophils Relative: 5 %
HEMATOCRIT: 37.2 % — AB (ref 40.0–52.0)
HEMOGLOBIN: 12.5 g/dL — AB (ref 13.0–18.0)
Lymphocytes Relative: 15 %
Lymphs Abs: 1.2 10*3/uL (ref 1.0–3.6)
MCH: 30.1 pg (ref 26.0–34.0)
MCHC: 33.5 g/dL (ref 32.0–36.0)
MCV: 89.7 fL (ref 80.0–100.0)
MONOS PCT: 7 %
Monocytes Absolute: 0.6 10*3/uL (ref 0.2–1.0)
NEUTROS ABS: 6 10*3/uL (ref 1.4–6.5)
NEUTROS PCT: 72 %
Platelets: 170 10*3/uL (ref 150–440)
RBC: 4.15 MIL/uL — AB (ref 4.40–5.90)
RDW: 15.2 % — ABNORMAL HIGH (ref 11.5–14.5)
WBC: 8.2 10*3/uL (ref 3.8–10.6)

## 2018-01-12 LAB — COMPREHENSIVE METABOLIC PANEL
ALBUMIN: 3.9 g/dL (ref 3.5–5.0)
ALK PHOS: 71 U/L (ref 38–126)
ALT: 31 U/L (ref 17–63)
ANION GAP: 9 (ref 5–15)
AST: 42 U/L — ABNORMAL HIGH (ref 15–41)
BILIRUBIN TOTAL: 0.6 mg/dL (ref 0.3–1.2)
BUN: 41 mg/dL — ABNORMAL HIGH (ref 6–20)
CALCIUM: 9.4 mg/dL (ref 8.9–10.3)
CO2: 22 mmol/L (ref 22–32)
Chloride: 108 mmol/L (ref 101–111)
Creatinine, Ser: 2.31 mg/dL — ABNORMAL HIGH (ref 0.61–1.24)
GFR calc Af Amer: 27 mL/min — ABNORMAL LOW (ref >60)
GFR calc non Af Amer: 23 mL/min — ABNORMAL LOW (ref >60)
GLUCOSE: 153 mg/dL — AB (ref 65–99)
POTASSIUM: 4.4 mmol/L (ref 3.5–5.1)
SODIUM: 139 mmol/L (ref 135–145)
TOTAL PROTEIN: 6.7 g/dL (ref 6.5–8.1)

## 2018-01-12 LAB — ETHANOL: Alcohol, Ethyl (B): <10 mg/dL (ref <10)

## 2018-01-12 LAB — TYPE AND SCREEN
ABO/RH(D): O POS
ANTIBODY SCREEN: NEGATIVE

## 2018-01-12 LAB — TROPONIN I: Troponin I: <0.03 ng/mL (ref <0.03)

## 2018-01-12 MED ORDER — IOPAMIDOL (ISOVUE-300) INJECTION 61%
100.0000 mL | Freq: Once | INTRAVENOUS | Status: AC | PRN
  Administered 2018-01-12: 100 mL via INTRAVENOUS

## 2018-01-12 MED ORDER — TETANUS-DIPHTH-ACELL PERTUSSIS 5-2.5-18.5 LF-MCG/0.5 IM SUSP
0.5000 mL | Freq: Once | INTRAMUSCULAR | Status: AC
  Administered 2018-01-12: 0.5 mL via INTRAMUSCULAR
  Filled 2018-01-12: qty 0.5

## 2018-01-12 MED ORDER — FENTANYL CITRATE (PF) 100 MCG/2ML IJ SOLN
50.0000 ug | Freq: Once | INTRAMUSCULAR | Status: AC
  Administered 2018-01-12: 50 ug via INTRAVENOUS
  Filled 2018-01-12: qty 2

## 2018-01-12 NOTE — ED Notes (Signed)
Report called to rebecca rn at [facility].  Pt alert.  Iv in place.

## 2018-01-12 NOTE — ED Notes (Signed)
Bleeding to abrasion on left cheek.  Cleaned and gauze in place.

## 2018-01-12 NOTE — ED Notes (Signed)
[facility] in with pt.  Son in with pt and he signed consent for transfer to [place].  Pt alert.  nsr on monitor.  Pt on 2 liters oxygen.  Pt voided approx 200cc urine in urinal.

## 2018-01-12 NOTE — ED Notes (Signed)
Patient transported to CT 

## 2018-01-12 NOTE — ED Notes (Signed)
EMTALA reviewed by this RN.  

## 2018-01-12 NOTE — ED Notes (Signed)
ED Provider at bedside.explaining transfer to patient and family.
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
## 2018-01-12 NOTE — ED Notes (Signed)
Pt return from ct scan.  Pt alert. Iv in place  meds given.  nsr on monitior.  Skin warm and dry.

## 2018-01-13 MED ORDER — TAMSULOSIN HCL 0.4 MG PO CAPS
0.40 | ORAL_CAPSULE | ORAL | Status: DC
Start: 2018-01-16 — End: 2018-01-13

## 2018-01-13 MED ORDER — METOPROLOL TARTRATE 5 MG/5ML IV SOLN
2.50 | INTRAVENOUS | Status: DC
Start: ? — End: 2018-01-13

## 2018-01-13 MED ORDER — LEVOTHYROXINE SODIUM 100 MCG IV SOLR
44.00 | INTRAVENOUS | Status: DC
Start: 2018-01-16 — End: 2018-01-13

## 2018-01-13 MED ORDER — ACETAMINOPHEN 160 MG/5ML PO SUSP
975.00 | ORAL | Status: DC
Start: 2018-01-16 — End: 2018-01-13

## 2018-01-13 MED ORDER — SODIUM CHLORIDE 0.9 % IJ SOLN
5.00 | INTRAMUSCULAR | Status: DC
Start: 2018-01-16 — End: 2018-01-13

## 2018-01-13 MED ORDER — SENNOSIDES-DOCUSATE SODIUM 8.6-50 MG PO TABS
2.00 | ORAL_TABLET | ORAL | Status: DC
Start: 2018-01-16 — End: 2018-01-13

## 2018-01-13 MED ORDER — SODIUM CHLORIDE 0.9 % IV SOLN
INTRAVENOUS | Status: DC
Start: ? — End: 2018-01-13

## 2018-01-13 MED ORDER — GENERIC EXTERNAL MEDICATION
40.00 | Status: DC
Start: 2018-01-14 — End: 2018-01-13

## 2018-01-13 MED ORDER — FINASTERIDE 5 MG PO TABS
5.00 | ORAL_TABLET | ORAL | Status: DC
Start: 2018-01-16 — End: 2018-01-13

## 2018-01-13 MED ORDER — METOPROLOL TARTRATE 25 MG PO TABS
25.00 | ORAL_TABLET | ORAL | Status: DC
Start: 2018-01-14 — End: 2018-01-13

## 2018-01-13 MED ORDER — SALINE NASAL SPRAY 0.65 % NA SOLN
1.00 | NASAL | Status: DC
Start: ? — End: 2018-01-13

## 2018-01-13 MED ORDER — AMLODIPINE BESYLATE 10 MG PO TABS
10.00 | ORAL_TABLET | ORAL | Status: DC
Start: 2018-01-16 — End: 2018-01-13

## 2018-01-13 MED ORDER — CALCIUM CARBONATE ANTACID 750 MG PO CHEW
CHEWABLE_TABLET | ORAL | Status: DC
Start: ? — End: 2018-01-13

## 2018-01-13 MED ORDER — ONDANSETRON HCL 4 MG/2ML IJ SOLN
4.00 | INTRAMUSCULAR | Status: DC
Start: ? — End: 2018-01-13

## 2018-01-13 MED ORDER — OXYCODONE HCL 5 MG/5ML PO SOLN
2.50 | ORAL | Status: DC
Start: ? — End: 2018-01-13

## 2018-01-16 ENCOUNTER — Ambulatory Visit: Payer: Medicare Other | Admitting: Family Medicine

## 2018-01-16 MED ORDER — MELATONIN 3 MG PO TABS
3.00 | ORAL_TABLET | ORAL | Status: DC
Start: 2018-01-16 — End: 2018-01-16

## 2018-01-16 MED ORDER — LOSARTAN POTASSIUM 50 MG PO TABS
25.00 | ORAL_TABLET | ORAL | Status: DC
Start: 2018-01-16 — End: 2018-01-16

## 2018-01-16 MED ORDER — RISPERIDONE 0.5 MG PO TBDP
.50 | ORAL_TABLET | ORAL | Status: DC
Start: ? — End: 2018-01-16

## 2018-01-16 MED ORDER — DEXTROSE-NACL 5-0.45 % IV SOLN
INTRAVENOUS | Status: DC
Start: 2018-01-16 — End: 2018-01-16

## 2018-01-16 MED ORDER — PANTOPRAZOLE SODIUM 40 MG PO TBEC
40.00 | DELAYED_RELEASE_TABLET | ORAL | Status: DC
Start: 2018-01-16 — End: 2018-01-16

## 2018-01-16 MED ORDER — HYDROMORPHONE HCL 1 MG/ML IJ SOLN
.25 | INTRAMUSCULAR | Status: DC
Start: ? — End: 2018-01-16

## 2018-01-19 ENCOUNTER — Telehealth: Payer: Self-pay | Admitting: Family Medicine
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
## 2018-01-19 NOTE — Telephone Encounter (Signed)
fyi

## 2018-01-19 NOTE — Telephone Encounter (Signed)
Noted. Thanks for cancelling the appointment.

## 2018-01-26 ENCOUNTER — Encounter
Admission: RE | Admit: 2018-01-26 | Discharge: 2018-01-26 | Disposition: A | Discharge status: Home / Self Care | Payer: Medicare Other | Source: Ambulatory Visit | Attending: Internal Medicine | Admitting: Internal Medicine

## 2018-01-27 MED ORDER — HEPARIN SODIUM (PORCINE) 5000 UNIT/ML IJ SOLN
5000.00 | INTRAMUSCULAR | Status: DC
Start: 2018-01-28 — End: 2018-01-27

## 2018-01-27 MED ORDER — MELATONIN 3 MG PO TBDP
6.00 | ORAL_TABLET | ORAL | Status: DC
Start: 2018-01-27 — End: 2018-01-27

## 2018-01-27 MED ORDER — VANCOMYCIN HCL 50 MG/ML PO SOLR
125.00 | ORAL | Status: DC
Start: 2018-01-27 — End: 2018-01-27

## 2018-01-27 MED ORDER — LIDOCAINE 5 % EX PTCH
1.00 | MEDICATED_PATCH | CUTANEOUS | Status: DC
Start: 2018-01-28 — End: 2018-01-27

## 2018-01-27 MED ORDER — DEXTROSE 50 % IV SOLN
12.50 | INTRAVENOUS | Status: DC
Start: ? — End: 2018-01-27

## 2018-01-27 MED ORDER — INSULIN REGULAR HUMAN 100 UNIT/ML IJ SOLN
.00 | INTRAMUSCULAR | Status: DC
Start: 2018-01-28 — End: 2018-01-27

## 2018-01-27 MED ORDER — RISPERIDONE 0.25 MG PO TABS
0.25 | ORAL_TABLET | ORAL | Status: DC
Start: 2018-01-27 — End: 2018-01-27

## 2018-01-27 MED ORDER — GLUCAGON HCL RDNA (DIAGNOSTIC) 1 MG IJ SOLR
1.00 | INTRAMUSCULAR | Status: DC
Start: ? — End: 2018-01-27

## 2018-01-27 MED ORDER — SERTRALINE HCL 25 MG PO TABS
12.50 | ORAL_TABLET | ORAL | Status: DC
Start: 2018-01-28 — End: 2018-01-27

## 2018-01-27 MED ORDER — SIMETHICONE 80 MG PO CHEW
80.00 | CHEWABLE_TABLET | ORAL | Status: DC
Start: ? — End: 2018-01-27

## 2018-01-27 MED ORDER — ONDANSETRON HCL 4 MG/2ML IJ SOLN
4.00 | INTRAMUSCULAR | Status: DC
Start: 2018-01-28 — End: 2018-01-27

## 2018-01-27 MED ORDER — LIME OIL OIL
TOPICAL_OIL | Status: DC
Start: ? — End: 2018-01-27

## 2018-01-27 MED ORDER — TRAMADOL HCL 50 MG PO TABS
25.00 | ORAL_TABLET | ORAL | Status: DC
Start: ? — End: 2018-01-27

## 2018-01-27 MED ORDER — TRAMADOL HCL 50 MG PO TABS
25.00 | ORAL_TABLET | ORAL | Status: DC
Start: 2018-01-28 — End: 2018-01-27

## 2018-01-27 MED ORDER — PANTOPRAZOLE SODIUM 40 MG PO TBEC
40.00 | DELAYED_RELEASE_TABLET | ORAL | Status: DC
Start: 2018-01-28 — End: 2018-01-27

## 2018-01-27 MED ORDER — CARVEDILOL 3.125 MG PO TABS
3.13 | ORAL_TABLET | ORAL | Status: DC
Start: 2018-01-27 — End: 2018-01-27

## 2018-01-27 MED ORDER — AMLODIPINE BESYLATE 10 MG PO TABS
10.00 | ORAL_TABLET | ORAL | Status: DC
Start: 2018-01-28 — End: 2018-01-27

## 2018-01-27 MED ORDER — LEVOTHYROXINE SODIUM 88 MCG PO TABS
88.00 | ORAL_TABLET | ORAL | Status: DC
Start: 2018-01-28 — End: 2018-01-27

## 2018-01-27 MED ORDER — PRAVASTATIN SODIUM 20 MG PO TABS
40.00 | ORAL_TABLET | ORAL | Status: DC
Start: 2018-01-27 — End: 2018-01-27

## 2018-01-27 MED ORDER — ACETAMINOPHEN 325 MG PO TABS
975.00 | ORAL_TABLET | ORAL | Status: DC
Start: 2018-01-27 — End: 2018-01-27

## 2018-01-27 MED ORDER — LOSARTAN POTASSIUM 50 MG PO TABS
50.00 | ORAL_TABLET | ORAL | Status: DC
Start: 2018-01-28 — End: 2018-01-27

## 2018-01-27 MED ORDER — SENNA 8.8 MG/5ML PO SYRP
10.00 | ORAL_SOLUTION | ORAL | Status: DC
Start: ? — End: 2018-01-27

## 2018-02-01 ENCOUNTER — Other Ambulatory Visit: Payer: Self-pay

## 2018-02-01 ENCOUNTER — Non-Acute Institutional Stay (SKILLED_NURSING_FACILITY): Payer: Medicare Other | Admitting: Gerontology

## 2018-02-01 ENCOUNTER — Emergency Department
Admission: EM | Admit: 2018-02-01 | Discharge: 2018-02-01 | Disposition: A | Discharge status: Home / Self Care | Payer: Medicare Other | Attending: Emergency Medicine | Admitting: Emergency Medicine

## 2018-02-01 ENCOUNTER — Emergency Department: Payer: Medicare Other

## 2018-02-01 DIAGNOSIS — R410 Disorientation, unspecified: Secondary | ICD-10-CM | POA: Insufficient documentation

## 2018-02-01 DIAGNOSIS — N390 Urinary tract infection, site not specified: Secondary | ICD-10-CM | POA: Insufficient documentation

## 2018-02-01 DIAGNOSIS — I1310 Hypertensive heart and chronic kidney disease without heart failure, with stage 1 through stage 4 chronic kidney disease, or unspecified chronic kidney disease: Secondary | ICD-10-CM | POA: Insufficient documentation

## 2018-02-01 DIAGNOSIS — R339 Retention of urine, unspecified: Secondary | ICD-10-CM | POA: Diagnosis not present

## 2018-02-01 DIAGNOSIS — Z87891 Personal history of nicotine dependence: Secondary | ICD-10-CM | POA: Insufficient documentation

## 2018-02-01 DIAGNOSIS — N3000 Acute cystitis without hematuria: Secondary | ICD-10-CM

## 2018-02-01 DIAGNOSIS — R404 Transient alteration of awareness: Secondary | ICD-10-CM

## 2018-02-01 DIAGNOSIS — N183 Chronic kidney disease, stage 3 (moderate): Secondary | ICD-10-CM | POA: Diagnosis not present

## 2018-02-01 DIAGNOSIS — I2510 Atherosclerotic heart disease of native coronary artery without angina pectoris: Secondary | ICD-10-CM | POA: Diagnosis not present

## 2018-02-01 LAB — CBC WITH DIFFERENTIAL/PLATELET
BASOS ABS: 0.1 10*3/uL (ref 0–0.1)
Basophils Relative: 1 %
EOS ABS: 0.2 10*3/uL (ref 0–0.7)
EOS PCT: 3 %
HCT: 34.6 % — ABNORMAL LOW (ref 40.0–52.0)
Hemoglobin: 11.4 g/dL — ABNORMAL LOW (ref 13.0–18.0)
LYMPHS PCT: 15 %
Lymphs Abs: 1.1 10*3/uL (ref 1.0–3.6)
MCH: 30 pg (ref 26.0–34.0)
MCHC: 32.9 g/dL (ref 32.0–36.0)
MCV: 91.2 fL (ref 80.0–100.0)
MONO ABS: 0.8 10*3/uL (ref 0.2–1.0)
Monocytes Relative: 11 %
Neutro Abs: 5.2 10*3/uL (ref 1.4–6.5)
Neutrophils Relative %: 70 %
PLATELETS: 294 10*3/uL (ref 150–440)
RBC: 3.79 MIL/uL — ABNORMAL LOW (ref 4.40–5.90)
RDW: 16.9 % — AB (ref 11.5–14.5)
WBC: 7.3 10*3/uL (ref 3.8–10.6)

## 2018-02-01 LAB — COMPREHENSIVE METABOLIC PANEL
ALT: 20 U/L (ref 17–63)
AST: 23 U/L (ref 15–41)
Albumin: 3.3 g/dL — ABNORMAL LOW (ref 3.5–5.0)
Alkaline Phosphatase: 95 U/L (ref 38–126)
Anion gap: 7 (ref 5–15)
BUN: 36 mg/dL — ABNORMAL HIGH (ref 6–20)
CHLORIDE: 102 mmol/L (ref 101–111)
CO2: 26 mmol/L (ref 22–32)
Calcium: 9.4 mg/dL (ref 8.9–10.3)
Creatinine, Ser: 1.96 mg/dL — ABNORMAL HIGH (ref 0.61–1.24)
GFR, EST AFRICAN AMERICAN: 32 mL/min — AB (ref >60)
GFR, EST NON AFRICAN AMERICAN: 28 mL/min — AB (ref >60)
Glucose, Bld: 135 mg/dL — ABNORMAL HIGH (ref 65–99)
POTASSIUM: 4.5 mmol/L (ref 3.5–5.1)
Sodium: 135 mmol/L (ref 135–145)
Total Bilirubin: 0.9 mg/dL (ref 0.3–1.2)
Total Protein: 6.5 g/dL (ref 6.5–8.1)

## 2018-02-01 LAB — URINALYSIS, COMPLETE (UACMP) WITH MICROSCOPIC
BILIRUBIN URINE: NEGATIVE
GLUCOSE, UA: NEGATIVE mg/dL
Ketones, ur: NEGATIVE mg/dL
Nitrite: NEGATIVE
PH: 6 (ref 5.0–8.0)
Protein, ur: NEGATIVE mg/dL
SPECIFIC GRAVITY, URINE: 1.004 — AB (ref 1.005–1.030)
Squamous Epithelial / LPF: NONE SEEN

## 2018-02-01 LAB — TROPONIN I: TROPONIN I: 0.03 ng/mL — AB (ref <0.03)

## 2018-02-01 MED ORDER — TRAMADOL HCL 50 MG PO TABS: 25.0000 mg | ORAL_TABLET | Freq: Three times a day (TID) | ORAL | 0 refills | Status: DC | PRN

## 2018-02-01 MED ORDER — CIPROFLOXACIN 500 MG/5ML (10%) PO SUSR: 500.0000 mg | Freq: Two times a day (BID) | ORAL | 0 refills | Status: DC

## 2018-02-01 MED ORDER — TRAMADOL HCL 50 MG PO TABS: 25.0000 mg | ORAL_TABLET | Freq: Three times a day (TID) | ORAL | 0 refills | Status: DC

## 2018-02-01 MED ORDER — SODIUM CHLORIDE 0.9 % IV SOLN
1.0000 g | Freq: Once | INTRAVENOUS | Status: AC
  Administered 2018-02-01: 1 g via INTRAVENOUS
  Filled 2018-02-01: qty 10
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
## 2018-02-01 NOTE — ED Notes (Signed)
Date and time results received: 02/01/18 3:24 PM  Test: Troponin Critical Value: 0.03  Name of Provider Notified: Dr. Jimmye Norman  Orders Received? Or Actions Taken?: acknowledged
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
## 2018-02-01 NOTE — Telephone Encounter (Signed)
Rx sent to [facility] phone : [phone] , fax : [phone]  
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
## 2018-02-02 LAB — URINE CULTURE: Special Requests: NORMAL

## 2018-02-07 ENCOUNTER — Non-Acute Institutional Stay (SKILLED_NURSING_FACILITY): Payer: Medicare Other | Admitting: Gerontology

## 2018-02-07 DIAGNOSIS — S22081D Stable burst fracture of T11-T12 vertebra, subsequent encounter for fracture with routine healing: Secondary | ICD-10-CM

## 2018-02-07 DIAGNOSIS — R404 Transient alteration of awareness: Secondary | ICD-10-CM | POA: Diagnosis not present

## 2018-02-07 DIAGNOSIS — S51011D Laceration without foreign body of right elbow, subsequent encounter: Secondary | ICD-10-CM | POA: Diagnosis not present

## 2018-02-07 DIAGNOSIS — K117 Disturbances of salivary secretion: Secondary | ICD-10-CM

## 2018-02-07 DIAGNOSIS — R682 Dry mouth, unspecified: Secondary | ICD-10-CM

## 2018-02-09 ENCOUNTER — Other Ambulatory Visit
Admission: RE | Admit: 2018-02-09 | Discharge: 2018-02-09 | Disposition: A | Discharge status: Home / Self Care | Payer: No Typology Code available for payment source | Source: Skilled Nursing Facility | Attending: Internal Medicine | Admitting: Internal Medicine

## 2018-02-09 DIAGNOSIS — R197 Diarrhea, unspecified: Secondary | ICD-10-CM | POA: Insufficient documentation

## 2018-02-09 LAB — C DIFFICILE QUICK SCREEN W PCR REFLEX
C DIFFICILE (CDIFF) INTERP: NOT DETECTED
C Diff antigen: NEGATIVE
C Diff toxin: NEGATIVE

## 2018-02-10 ENCOUNTER — Other Ambulatory Visit
Admission: RE | Admit: 2018-02-10 | Discharge: 2018-02-10 | Disposition: A | Discharge status: Home / Self Care | Payer: No Typology Code available for payment source | Source: Ambulatory Visit | Attending: Gerontology | Admitting: Gerontology

## 2018-02-10 DIAGNOSIS — X58XXXA Exposure to other specified factors, initial encounter: Secondary | ICD-10-CM | POA: Insufficient documentation

## 2018-02-10 DIAGNOSIS — T148XXA Other injury of unspecified body region, initial encounter: Secondary | ICD-10-CM | POA: Insufficient documentation

## 2018-02-13 ENCOUNTER — Other Ambulatory Visit: Payer: Self-pay | Admitting: Neurosurgery

## 2018-02-13 ENCOUNTER — Encounter
Admission: RE | Admit: 2018-02-13 | Discharge: 2018-02-13 | Disposition: A | Discharge status: Home / Self Care | Payer: Medicare Other | Source: Ambulatory Visit | Attending: Internal Medicine | Admitting: Internal Medicine

## 2018-02-13 ENCOUNTER — Telehealth: Payer: Self-pay

## 2018-02-13 DIAGNOSIS — S22082A Unstable burst fracture of T11-T12 vertebra, initial encounter for closed fracture: Secondary | ICD-10-CM
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
## 2018-02-13 NOTE — Telephone Encounter (Signed)
I do not know the details of why they cut back on this medication.  Its most often for dizziness when standing or low blood pressure.  These are an issue, that he cannot increase his dose back to his previous dose.  If this is not the issue, I am fine with him going back to his original dose as long as the monitor for above symptoms.   [name], MD

## 2018-02-14 ENCOUNTER — Ambulatory Visit
Admission: RE | Admit: 2018-02-14 | Discharge: 2018-02-14 | Disposition: A | Discharge status: Home / Self Care | Payer: Medicare Other | Source: Ambulatory Visit | Attending: Neurosurgery | Admitting: Neurosurgery

## 2018-02-14 DIAGNOSIS — X58XXXA Exposure to other specified factors, initial encounter: Secondary | ICD-10-CM | POA: Insufficient documentation

## 2018-02-14 DIAGNOSIS — S22082A Unstable burst fracture of T11-T12 vertebra, initial encounter for closed fracture: Secondary | ICD-10-CM

## 2018-02-14 DIAGNOSIS — S22081A Stable burst fracture of T11-T12 vertebra, initial encounter for closed fracture: Secondary | ICD-10-CM | POA: Insufficient documentation

## 2018-02-14 LAB — AEROBIC CULTURE W GRAM STAIN (SUPERFICIAL SPECIMEN)

## 2018-02-14 LAB — AEROBIC CULTURE  (SUPERFICIAL SPECIMEN)

## 2018-02-15 NOTE — Telephone Encounter (Signed)
Left message for [facility].

## 2018-02-15 NOTE — Telephone Encounter (Signed)
Spoke with Benjie Karvonen who stated Heron Nay has restarted pt on flomax bid. Benjie Karvonen also stated that pt has an appt with [facility] on Friday.

## 2018-02-16 ENCOUNTER — Other Ambulatory Visit (HOSPITAL_COMMUNITY): Payer: Self-pay | Admitting: Student

## 2018-02-16 ENCOUNTER — Non-Acute Institutional Stay (SKILLED_NURSING_FACILITY): Payer: Medicare Other | Admitting: Gerontology

## 2018-02-16 DIAGNOSIS — S22081D Stable burst fracture of T11-T12 vertebra, subsequent encounter for fracture with routine healing: Secondary | ICD-10-CM | POA: Diagnosis not present

## 2018-02-16 DIAGNOSIS — S22081A Stable burst fracture of T11-T12 vertebra, initial encounter for closed fracture: Secondary | ICD-10-CM

## 2018-02-16 DIAGNOSIS — R339 Retention of urine, unspecified: Secondary | ICD-10-CM | POA: Diagnosis not present

## 2018-02-16 DIAGNOSIS — R110 Nausea: Secondary | ICD-10-CM | POA: Diagnosis not present

## 2018-02-17 ENCOUNTER — Encounter: Payer: Self-pay | Admitting: Urology

## 2018-02-17 ENCOUNTER — Ambulatory Visit (INDEPENDENT_AMBULATORY_CARE_PROVIDER_SITE_OTHER): Payer: Medicare Other | Admitting: Urology

## 2018-02-17 VITALS — BP 125/62 | HR 81 | Ht 72.0 in

## 2018-02-17 DIAGNOSIS — R338 Other retention of urine: Secondary | ICD-10-CM

## 2018-02-17 DIAGNOSIS — N401 Enlarged prostate with lower urinary tract symptoms: Secondary | ICD-10-CM | POA: Diagnosis not present

## 2018-02-17 DIAGNOSIS — R3915 Urgency of urination: Secondary | ICD-10-CM | POA: Diagnosis not present
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
## 2018-02-20 ENCOUNTER — Other Ambulatory Visit
Admission: RE | Admit: 2018-02-20 | Discharge: 2018-02-20 | Disposition: A | Discharge status: Home / Self Care | Payer: Medicare Other | Source: Ambulatory Visit | Attending: Gerontology | Admitting: Gerontology

## 2018-02-20 ENCOUNTER — Telehealth: Payer: Self-pay | Admitting: Family Medicine

## 2018-02-20 DIAGNOSIS — R197 Diarrhea, unspecified: Secondary | ICD-10-CM | POA: Insufficient documentation

## 2018-02-20 DIAGNOSIS — R748 Abnormal levels of other serum enzymes: Secondary | ICD-10-CM

## 2018-02-20 LAB — COMPREHENSIVE METABOLIC PANEL
ALK PHOS: 81 U/L (ref 38–126)
ALT: 17 U/L (ref 17–63)
ANION GAP: 6 (ref 5–15)
AST: 24 U/L (ref 15–41)
Albumin: 3 g/dL — ABNORMAL LOW (ref 3.5–5.0)
BUN: 29 mg/dL — ABNORMAL HIGH (ref 6–20)
CALCIUM: 10 mg/dL (ref 8.9–10.3)
CO2: 27 mmol/L (ref 22–32)
CREATININE: 1.74 mg/dL — AB (ref 0.61–1.24)
Chloride: 105 mmol/L (ref 101–111)
GFR, EST AFRICAN AMERICAN: 37 mL/min — AB (ref >60)
GFR, EST NON AFRICAN AMERICAN: 32 mL/min — AB (ref >60)
Glucose, Bld: 83 mg/dL (ref 65–99)
Potassium: 4 mmol/L (ref 3.5–5.1)
SODIUM: 138 mmol/L (ref 135–145)
TOTAL PROTEIN: 5.8 g/dL — AB (ref 6.5–8.1)
Total Bilirubin: 0.5 mg/dL (ref 0.3–1.2)

## 2018-02-20 LAB — CBC WITH DIFFERENTIAL/PLATELET
BASOS PCT: 1 %
Basophils Absolute: 0 10*3/uL (ref 0–0.1)
Eosinophils Absolute: 0.3 10*3/uL (ref 0–0.7)
Eosinophils Relative: 6 %
HEMATOCRIT: 32.2 % — AB (ref 40.0–52.0)
Hemoglobin: 10.4 g/dL — ABNORMAL LOW (ref 13.0–18.0)
LYMPHS ABS: 1.3 10*3/uL (ref 1.0–3.6)
Lymphocytes Relative: 27 %
MCH: 28.9 pg (ref 26.0–34.0)
MCHC: 32.3 g/dL (ref 32.0–36.0)
MCV: 89.4 fL (ref 80.0–100.0)
MONO ABS: 0.7 10*3/uL (ref 0.2–1.0)
MONOS PCT: 15 %
Neutro Abs: 2.4 10*3/uL (ref 1.4–6.5)
Neutrophils Relative %: 51 %
Platelets: 255 10*3/uL (ref 150–440)
RBC: 3.6 MIL/uL — ABNORMAL LOW (ref 4.40–5.90)
RDW: 16.5 % — AB (ref 11.5–14.5)
WBC: 4.8 10*3/uL (ref 3.8–10.6)

## 2018-02-20 LAB — VITAMIN B12: VITAMIN B 12: 1384 pg/mL — AB (ref 180–914)

## 2018-02-20 LAB — MAGNESIUM: Magnesium: 2.1 mg/dL (ref 1.7–2.4)

## 2018-02-20 LAB — TSH: TSH: 2.165 u[IU]/mL (ref 0.350–4.500)

## 2018-02-20 NOTE — Telephone Encounter (Signed)
I am not sure that we can add this given that they were done through the hospital.  Someone will need to contact the lab at the hospital to see if this is an option.  Please confirm he is still in rehab.  Please confirm that they ordered the labs.

## 2018-02-20 NOTE — Telephone Encounter (Signed)
I looked in his chart at this particular appointment and I'm not sure what he is referring to . Please advise

## 2018-02-20 NOTE — Telephone Encounter (Signed)
Tried calling, voicemail full, I believe he means OV 10/21/17 he was seen for increased lipase. We rechecked 10/28/17 and it had much improved. [place] for pec to speak to patient

## 2018-02-20 NOTE — Telephone Encounter (Signed)
They need a paper copy faxed to [facility] lab to add with a signature Fax: [phone] Original orders ordered by Toni Arthurs. faxed

## 2018-02-20 NOTE — Addendum Note (Signed)
Addended by: [name] on: 02/20/2018 02:52 PM   Modules accepted: Orders

## 2018-02-20 NOTE — Telephone Encounter (Signed)
I will forward to the lab staff to see if they can contact the lab at the hospital to see if we can add a lipase.  I have placed an order.

## 2018-02-20 NOTE — Telephone Encounter (Signed)
Copied from [place]. Topic: Quick Communication - See Telephone Encounter >> Feb 20, 2018  8:44 AM Aurelio Brash B wrote: CRM for notification. See Telephone encounter for: 02/20/18. PT would like a call from Dr Tharon Aquas nurse,  he wants information from his apt 10/22/27 about his stomach

## 2018-02-20 NOTE — Telephone Encounter (Signed)
Please advise 

## 2018-02-20 NOTE — Telephone Encounter (Signed)
Patient is currently in rehab per son

## 2018-02-20 NOTE — Telephone Encounter (Signed)
Are we able to add this?

## 2018-02-20 NOTE — Telephone Encounter (Signed)
Pt called and given information per Thurmond Butts dated 02/20/18; he would like to make sure that a lipase was included In the labs that were drawn 02/20/18; will route to office for notification of pt request.

## 2018-02-20 NOTE — Telephone Encounter (Signed)
Please call lab at hospital to add on test, labs were not done here. [phone]/ [phone]

## 2018-02-21 LAB — VITAMIN D 25 HYDROXY (VIT D DEFICIENCY, FRACTURES): VIT D 25 HYDROXY: 35.5 ng/mL (ref 30.0–100.0)

## 2018-02-23 ENCOUNTER — Non-Acute Institutional Stay (SKILLED_NURSING_FACILITY): Payer: Medicare Other | Admitting: Gerontology

## 2018-02-23 ENCOUNTER — Encounter: Payer: Self-pay | Admitting: Gerontology

## 2018-02-23 DIAGNOSIS — R339 Retention of urine, unspecified: Secondary | ICD-10-CM | POA: Diagnosis not present

## 2018-02-23 DIAGNOSIS — S51011D Laceration without foreign body of right elbow, subsequent encounter: Secondary | ICD-10-CM | POA: Diagnosis not present

## 2018-02-23 DIAGNOSIS — S22081D Stable burst fracture of T11-T12 vertebra, subsequent encounter for fracture with routine healing: Secondary | ICD-10-CM | POA: Diagnosis not present

## 2018-02-23 DIAGNOSIS — R110 Nausea: Secondary | ICD-10-CM
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
## 2018-03-01 ENCOUNTER — Encounter: Payer: Medicare Other | Attending: Internal Medicine | Admitting: Internal Medicine

## 2018-03-01 ENCOUNTER — Encounter: Payer: Self-pay | Admitting: Gerontology

## 2018-03-01 ENCOUNTER — Non-Acute Institutional Stay (SKILLED_NURSING_FACILITY): Payer: Medicare Other | Admitting: Gerontology

## 2018-03-01 DIAGNOSIS — Z87891 Personal history of nicotine dependence: Secondary | ICD-10-CM | POA: Insufficient documentation

## 2018-03-01 DIAGNOSIS — X58XXXA Exposure to other specified factors, initial encounter: Secondary | ICD-10-CM | POA: Diagnosis not present

## 2018-03-01 DIAGNOSIS — S22081D Stable burst fracture of T11-T12 vertebra, subsequent encounter for fracture with routine healing: Secondary | ICD-10-CM | POA: Diagnosis not present

## 2018-03-01 DIAGNOSIS — Z8673 Personal history of transient ischemic attack (TIA), and cerebral infarction without residual deficits: Secondary | ICD-10-CM | POA: Insufficient documentation

## 2018-03-01 DIAGNOSIS — R339 Retention of urine, unspecified: Secondary | ICD-10-CM | POA: Diagnosis not present

## 2018-03-01 DIAGNOSIS — T8133XA Disruption of traumatic injury wound repair, initial encounter: Secondary | ICD-10-CM | POA: Diagnosis not present

## 2018-03-01 DIAGNOSIS — R110 Nausea: Secondary | ICD-10-CM

## 2018-03-01 DIAGNOSIS — L89102 Pressure ulcer of unspecified part of back, stage 2: Secondary | ICD-10-CM | POA: Insufficient documentation

## 2018-03-01 NOTE — Assessment & Plan Note (Signed)
Stable. Continues to work with PT/OT. Progressing well. Mobility improving. Pt reports he still has pain frequently. On Tramadol 25 mg TID scheduled and Q 4 hours prn, Tylenol 975 mg po TID, RObaxin 500 mg po Q 6 hours prn and Aspercreme (Lidocaine) 4% patch. Wearing LSO brace when up.

## 2018-03-01 NOTE — Assessment & Plan Note (Signed)
Stable. Able to leave foley out. Does c/o frequent, small voids- mostly incontinent. Pt is on Flomax and Proscar.
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
## 2018-03-02 ENCOUNTER — Ambulatory Visit: Payer: Medicare Other | Admitting: Urology

## 2018-03-03 ENCOUNTER — Other Ambulatory Visit (HOSPITAL_COMMUNITY): Payer: Medicare Other

## 2018-03-03 ENCOUNTER — Ambulatory Visit (HOSPITAL_COMMUNITY)
Admission: RE | Admit: 2018-03-03 | Discharge: 2018-03-03 | Disposition: A | Discharge status: Home / Self Care | Payer: Medicare Other | Source: Ambulatory Visit | Attending: Student | Admitting: Student

## 2018-03-06 NOTE — Progress Notes (Signed)
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 Complaints and Symptoms: Review of System Notes: T11-T12 fracture HBO Extended History Items Eyes: Cataracts Immunizations Pneumococcal Vaccine: Received Pneumococcal Vaccination: Yes KARMELO, BASS ([id number]) Implantable Devices Family and Social History Cancer: No; Diabetes: No; Heart Disease: Yes - Father; Hereditary Spherocytosis: No; Hypertension: No; Kidney Disease: No;  Lung Disease: No; Seizures: No; Stroke: No; Thyroid Problems: No; Tuberculosis: No; Former smoker - quit at 84 yrs old; Marital Status - Widowed; Alcohol Use: Never; Drug Use: No History; Caffeine Use: Daily; Financial Concerns: No; Food, Clothing or Shelter Needs: No; Support System Lacking: No; Transportation Concerns: No; Advanced Directives: No; Patient does not want information on Advanced Directives; Do not resuscitate: No; Living Will: No; Medical Power of Attorney: Yes - donna  (daughter) (Not Provided) Electronic Signature(s) Signed: 03/01/2018 4:34:44 PM By: Roger Shelter Signed: 03/01/2018 5:09:03 PM By: Linton Ham MD Entered By: Roger Shelter on 03/01/2018 13:01:38 Batterton, Lolly Mustache ([id number]) -------------------------------------------------------------------------------- SuperBill Details Patient Name: Birnie, Terrin C. Date of Service: 03/01/2018 Medical Record Number: [id number] Patient Account Number: [id number] Date of Birth/Sex: [birth year] (84 y.o. Male) Treating RN: Cornell Barman Primary Care Provider: Caryl Bis, ERIC Other Clinician: Referring Provider: Marin Olp Treating Provider/Extender: Ricard Dillon Weeks in Treatment: 0 Diagnosis Coding ICD-10 Codes Code Description L89.102 Pressure ulcer of unspecified part of back, stage 2 S51.011D Laceration without foreign body of right elbow, subsequent encounter Facility Procedures CPT4 Code: 37290211 Description: [place] VISIT-LEV 3 EST PT Modifier: Quantity: 1 CPT4 Code: 15520802 Description: [place] - DEB SUBQ TISSUE 20 SQ CM/< ICD-10 Diagnosis Description L89.102 Pressure ulcer of unspecified part of back, stage 2 Modifier: Quantity: 1 CPT4 Code: 23361224 Description: [place] - CHEM CAUT GRANULATION TISS ICD-10 Diagnosis Description S51.011D Laceration without foreign body of right elbow, subsequent Modifier: 70 encounter Quantity: 1 Physician Procedures CPT4 Code: 4975300 Description: WC PHYS LEVEL 3 o NEW PT ICD-10 Diagnosis Description L89.102 Pressure ulcer of unspecified part of back, stage 2 S51.011D Laceration without foreign body of right elbow, subsequent Modifier: 25 encounter Quantity: 1 CPT4 Code: 5110211 Description: [place] - WC PHYS SUBQ TISS 20 SQ CM ICD-10 Diagnosis Description L89.102 Pressure ulcer of unspecified part of back, stage 2 Modifier: Quantity: 1 CPT4 Code: 1735670 Description: 14103 - WC PHYS CHEM CAUT GRAN TISSUE ICD-10 Diagnosis  Description S51.011D Laceration without foreign body of right elbow, subsequent Modifier: 59 encounter Quantity: 1 Electronic Signature(s) Signed: 03/01/2018 5:09:03 PM By: Linton Ham MD Entered By: Linton Ham on 03/01/2018 13:57:13

## 2018-03-06 NOTE — Progress Notes (Signed)
JORELL, AGNE ([id number]) Visit Report for 03/01/2018 Abuse/Suicide Risk Screen Details Patient Name: Cody Blackburn, Cody Blackburn. Date of Service: 03/01/2018 12:30 PM Medical Record Number: [id number] Patient Account Number: [id number] Date of Birth/Sex: Sep 27, 1923 (84 y.o. Male) Treating RN: Roger Shelter Primary Care Skiler Tye: Caryl Bis, ERIC Other Clinician: Referring Tobin Witucki: Marin Olp Treating Jamyia Fortune/Extender: Ricard Dillon Weeks in Treatment: 0 Abuse/Suicide Risk Screen Items Answer ABUSE/SUICIDE RISK SCREEN: Has anyone close to you tried to hurt or harm you recentlyo No Do you feel uncomfortable with anyone in your familyo No Has anyone forced you do things that you didnot want to doo No Do you have any thoughts of harming yourselfo No Patient displays signs or symptoms of abuse and/or neglect. No Electronic Signature(s) Signed: 03/01/2018 4:34:44 PM By: Roger Shelter Entered By: Roger Shelter on 03/01/2018 13:01:45 Alms, Rahn CMarland Kitchen ([id number]) -------------------------------------------------------------------------------- Activities of Daily Living Details Patient Name: Duffell, Ayomikun C. Date of Service: 03/01/2018 12:30 PM Medical Record Number: [id number] Patient Account Number: [id number] Date of Birth/Sex: 08/27/1923 (84 y.o. Male) Treating RN: Roger Shelter Primary Care Emersyn Kotarski: Caryl Bis, ERIC Other Clinician: Referring Kyanne Rials: Marin Olp Treating Azrael Maddix/Extender: Ricard Dillon Weeks in Treatment: 0 Activities of Daily Living Items Answer Activities of Daily Living (Please select one for each item) Drive Automobile Not Able Take Medications Need Assistance Use Telephone Completely Able Care for Appearance Need Assistance Use Toilet Need Assistance Bath / Shower Need Assistance Dress Self Need Assistance Feed Self Completely Able Walk Not Able Get In / Out Bed Need Assistance Housework Not Able Prepare Meals Not Able Handle Money Need  Assistance Shop for Self Not Able Electronic Signature(s) Signed: 03/01/2018 4:34:44 PM By: Roger Shelter Entered By: Roger Shelter on 03/01/2018 13:02:31 Werst, Lolly Mustache ([id number]) -------------------------------------------------------------------------------- Education Assessment Details Patient Name: Lionel December. Date of Service: 03/01/2018 12:30 PM Medical Record Number: [id number] Patient Account Number: [id number] Date of Birth/Sex: [birth year] (84 y.o. Male) Treating RN: Roger Shelter Primary Care Sonnet Rizor: Caryl Bis, ERIC Other Clinician: Referring Donnamae Muilenburg: Marin Olp Treating Dandra Shambaugh/Extender: Tito Dine in Treatment: 0 Primary Learner Assessed: Patient Learning Preferences/Education Level/Primary Language Learning Preference: Printed Material Highest Education Level: College or Above Preferred Language: English Cognitive Barrier Assessment/Beliefs Language Barrier: No Translator Needed: No Memory Deficit: No Emotional Barrier: No Cultural/Religious Beliefs Affecting Medical Care: No Physical Barrier Assessment Impaired Vision: Yes Glasses Impaired Hearing: No Decreased Hand dexterity: No Knowledge/Comprehension Assessment Knowledge Level: Medium Comprehension Level: Medium Ability to understand written Medium instructions: Ability to understand verbal Medium instructions: Motivation Assessment Anxiety Level: Calm Cooperation: Cooperative Education Importance: Acknowledges Need Interest in Health Problems: Asks Questions Perception: Coherent Willingness to Engage in Self- Medium Management Activities: Readiness to Engage in Self- Medium Management Activities: Electronic Signature(s) Signed: 03/01/2018 4:34:44 PM By: Roger Shelter Entered By: Roger Shelter on 03/01/2018 13:02:58 Creson, Lolly Mustache ([id number]) -------------------------------------------------------------------------------- Fall Risk Assessment  Details Patient Name: Lionel December. Date of Service: 03/01/2018 12:30 PM Medical Record Number: [id number] Patient Account Number: [id number] Date of Birth/Sex: Oct 29, 1923 (84 y.o. Male) Treating RN: Roger Shelter Primary Care Ameena Vesey: Caryl Bis, ERIC Other Clinician: Referring Wilena Tyndall: Marin Olp Treating Tion Tse/Extender: Ricard Dillon Weeks in Treatment: 0 Fall Risk Assessment Items Have you had 2 or more falls in the last 12 monthso 0 No Have you had any fall that resulted in injury in the last 12 monthso 0 No FALL RISK ASSESSMENT: History of falling - immediate or within 3 months 0 No Secondary diagnosis 0 No Ambulatory aid None/bed rest/wheelchair/nurse 0 Yes Crutches/cane/walker 15 Yes  Furniture 0 No IV Access/Saline Lock 0 No Gait/Training Normal/bed rest/immobile 0 No Weak 0 No Impaired 20 Yes Mental Status Oriented to own ability 0 Yes Electronic Signature(s) Signed: 03/01/2018 4:34:44 PM By: Roger Shelter Entered By: Roger Shelter on 03/01/2018 13:03:26 Horsley, Lolly Mustache ([id number]) -------------------------------------------------------------------------------- Foot Assessment Details Patient Name: Germano, Kacy C. Date of Service: 03/01/2018 12:30 PM Medical Record Number: [id number] Patient Account Number: [id number] Date of Birth/Sex: 1923/10/04 (84 y.o. Male) Treating RN: Roger Shelter Primary Care Josue Kass: Caryl Bis, ERIC Other Clinician: Referring Ellenore Roscoe: Marin Olp Treating Boden Stucky/Extender: Ricard Dillon Weeks in Treatment: 0 Foot Assessment Items Site Locations + = Sensation present, - = Sensation absent, C = Callus, U = Ulcer R = Redness, W = Warmth, M = Maceration, PU = Pre-ulcerative lesion F = Fissure, S = Swelling, D = Dryness Assessment Right: Left: Other Deformity: No No Prior Foot Ulcer: No No Prior Amputation: No No Charcot Joint: No No Ambulatory Status: Gait: Electronic Signature(s) Signed: 03/01/2018  4:34:44 PM By: Roger Shelter Entered By: Roger Shelter on 03/01/2018 13:04:08 Haub, Lolly Mustache ([id number]) -------------------------------------------------------------------------------- Nutrition Risk Assessment Details Patient Name: Ghanem, Damion C. Date of Service: 03/01/2018 12:30 PM Medical Record Number: [id number] Patient Account Number: [id number] Date of Birth/Sex: [birth year] (84 y.o. Male) Treating RN: Roger Shelter Primary Care Agness Sibrian: Caryl Bis, ERIC Other Clinician: Referring Sidra Oldfield: Marin Olp Treating Belal Scallon/Extender: Ricard Dillon Weeks in Treatment: 0 Height (in): 72 Weight (lbs): 178 Body Mass Index (BMI): 24.1 Nutrition Risk Assessment Items NUTRITION RISK SCREEN: I have an illness or condition that made me change the kind and/or amount of 2 Yes food I eat I eat fewer than two meals per day 3 Yes I eat few fruits and vegetables, or milk products 0 No I have three or more drinks of beer, liquor or wine almost every day 0 No I have tooth or mouth problems that make it hard for me to eat 0 No I don't always have enough money to buy the food I need 0 No I eat alone most of the time 0 No I take three or more different prescribed or over-the-counter drugs a day 1 Yes Without wanting to, I have lost or gained 10 pounds in the last six months 0 No I am not always physically able to shop, cook and/or feed myself 0 No Nutrition Protocols Good Risk Protocol Moderate Risk Protocol Electronic Signature(s) Signed: 03/01/2018 4:34:44 PM By: Roger Shelter Entered By: Roger Shelter on 03/01/2018 13:03:58

## 2018-03-06 NOTE — Progress Notes (Addendum)
DOUGLAS, SMOLINSKY ([id number]) Visit Report for 03/01/2018 Allergy List Details Patient Name: Mcnee, SACHA RADLOFF. Date of Service: 03/01/2018 12:30 PM Medical Record Number: [id number] Patient Account Number: [id number] Date of Birth/Sex: May 15, 1923 (84 y.o. Male) Treating RN: Roger Shelter Primary Care Morey Andonian: Caryl Bis, ERIC Other Clinician: Referring Cervando Durnin: Marin Olp Treating Mckay Brandt/Extender: Ricard Dillon Weeks in Treatment: 0 Allergies Active Allergies cerivastatin Zocor Allergy Notes Electronic Signature(s) Signed: 03/01/2018 4:34:44 PM By: Roger Shelter Entered By: Roger Shelter on 03/01/2018 12:50:58 Dante, Lolly Mustache ([id number]) -------------------------------------------------------------------------------- Arrival Information Details Patient Name: Lionel December. Date of Service: 03/01/2018 12:30 PM Medical Record Number: [id number] Patient Account Number: [id number] Date of Birth/Sex: 02-01-1923 (84 y.o. Male) Treating RN: Roger Shelter Primary Care Arlynn Stare: Caryl Bis, ERIC Other Clinician: Referring Dakai Braithwaite: Marin Olp Treating Azalya Galyon/Extender: Tito Dine in Treatment: 0 Visit Information Patient Arrived: Wheel Chair Arrival Time: 12:40 Accompanied By: daughter Transfer Assistance: EasyPivot Patient Lift Patient Identification Verified: Yes Secondary Verification Process Yes Completed: Patient Requires Transmission-Based No Precautions: Patient Has Alerts: No Electronic Signature(s) Signed: 03/01/2018 4:34:44 PM By: Roger Shelter Entered By: Roger Shelter on 03/01/2018 12:47:03 Cai, Lolly Mustache ([id number]) -------------------------------------------------------------------------------- Clinic Level of Care Assessment Details Patient Name: SHAAN, RHOADS. Date of Service: 03/01/2018 12:30 PM Medical Record Number: [id number] Patient Account Number: [id number] Date of Birth/Sex: Mar 25, 1923 (84 y.o. Male) Treating RN:  Cornell Barman Primary Care Jordyn Doane: Caryl Bis, ERIC Other Clinician: Referring Priyana Mccarey: Marin Olp Treating Tabias Swayze/Extender: Ricard Dillon Weeks in Treatment: 0 Clinic Level of Care Assessment Items TOOL 1 Quantity Score []  - Use when EandM and Procedure is performed on INITIAL visit 0 ASSESSMENTS - Nursing Assessment / Reassessment X - General Physical Exam (combine w/ comprehensive assessment (listed just below) when 1 20 performed on new pt. evals) X- 1 25 Comprehensive Assessment (HX, ROS, Risk Assessments, Wounds Hx, etc.) ASSESSMENTS - Wound and Skin Assessment / Reassessment []  - Dermatologic / Skin Assessment (not related to wound area) 0 ASSESSMENTS - Ostomy and/or Continence Assessment and Care []  - Incontinence Assessment and Management 0 []  - 0 Ostomy Care Assessment and Management (repouching, etc.) PROCESS - Coordination of Care X - Simple Patient / Family Education for ongoing care 1 15 []  - 0 Complex (extensive) Patient / Family Education for ongoing care X- 1 10 Staff obtains Programmer, systems, Records, Test Results / Process Orders []  - 0 Staff telephones HHA, Nursing Homes / Clarify orders / etc []  - 0 Routine Transfer to another Facility (non-emergent condition) []  - 0 Routine Hospital Admission (non-emergent condition) X- 1 15 New Admissions / Biomedical engineer / Ordering NPWT, Apligraf, etc. []  - 0 Emergency Hospital Admission (emergent condition) PROCESS - Special Needs []  - Pediatric / Minor Patient Management 0 []  - 0 Isolation Patient Management []  - 0 Hearing / Language / Visual special needs []  - 0 Assessment of Community assistance (transportation, D/C planning, etc.) []  - 0 Additional assistance / Altered mentation []  - 0 Support Surface(s) Assessment (bed, cushion, seat, etc.) Morr, Harol C. ([id number]) INTERVENTIONS - Miscellaneous []  - External ear exam 0 []  - 0 Patient Transfer (multiple staff / Civil Service fast streamer / Similar  devices) []  - 0 Simple Staple / Suture removal (25 or less) []  - 0 Complex Staple / Suture removal (26 or more) []  - 0 Hypo/Hyperglycemic Management (do not check if billed separately) []  - 0 Ankle / Brachial Index (ABI) - do not check if billed separately Has the patient been seen at the hospital within the last three years: Yes  Total Score: 85 Level Of Care: New/Established - Level 3 Electronic Signature(s) Signed: 03/01/2018 5:11:52 PM By: Gretta Cool, BSN, RN, CWS, Kim RN, BSN Entered By: Gretta Cool, BSN, RN, CWS, Kim on 03/01/2018 13:29:00 KRISTAPHER, DUBUQUE ([id number]) -------------------------------------------------------------------------------- Encounter Discharge Information Details Patient Name: [name] Date of Service: 03/01/2018 12:30 PM Medical Record Number: [id number] Patient Account Number: [id number] Date of Birth/Sex: 1923/09/05 (84 y.o. Male) Treating RN: Cornell Barman Primary Care Mili Piltz: Caryl Bis, ERIC Other Clinician: Referring Porchea Charrier: Marin Olp Treating Raidyn Wassink/Extender: Tito Dine in Treatment: 0 Encounter Discharge Information Items Discharge Pain Level: 0 Discharge Condition: Stable Ambulatory Status: Wheelchair Discharge Destination: Nursing Home Transportation: Private Auto Accompanied By: dtr Schedule Follow-up Appointment: Yes Medication Reconciliation completed and No provided to Patient/Care Breonia Kirstein: Provided on Clinical Summary of Care: 03/01/2018 Form Type Recipient Paper Patient [facility] Electronic Signature(s) Signed: 03/01/2018 4:39:21 PM By: Montey Hora Entered By: Montey Hora on 03/01/2018 14:04:21 [place], Lolly Mustache ([id number]) -------------------------------------------------------------------------------- Lower Extremity Assessment Details Patient Name: Duque, Gianmarco C. Date of Service: 03/01/2018 12:30 PM Medical Record Number: [id number] Patient Account Number: [id number] Date of Birth/Sex: [birth year] (84 y.o.  Male) Treating RN: Roger Shelter Primary Care Brodie Correll: Caryl Bis, ERIC Other Clinician: Referring Verdis Koval: Marin Olp Treating Birch Farino/Extender: Ricard Dillon Weeks in Treatment: 0 Electronic Signature(s) Signed: 03/01/2018 4:34:44 PM By: Roger Shelter Entered By: Roger Shelter on 03/01/2018 13:04:21 Pitre, Lolly Mustache ([id number]) -------------------------------------------------------------------------------- Multi Wound Chart Details Patient Name: Schiller, Krishon C. Date of Service: 03/01/2018 12:30 PM Medical Record Number: [id number] Patient Account Number: [id number] Date of Birth/Sex: 03/09/1923 (84 y.o. Male) Treating RN: Cornell Barman Primary Care Plez Belton: Caryl Bis, ERIC Other Clinician: Referring Yunuen Mordan: Marin Olp Treating Darcell Yacoub/Extender: Ricard Dillon Weeks in Treatment: 0 Vital Signs Height(in): 72 Pulse(bpm): 5 Weight(lbs): 178 Blood Pressure(mmHg): 136/54 Body Mass Index(BMI): 24 Temperature(F): 97.6 Respiratory Rate 18 (breaths/min): Photos: [1:No Photos] [2:No Photos] [N/A:N/A] Wound Location: [1:Back - Midline] [2:Right Elbow] [N/A:N/A] Wounding Event: [1:Pressure Injury] [2:Trauma] [N/A:N/A] Primary Etiology: [1:Pressure Ulcer] [2:Trauma, Other] [N/A:N/A] Comorbid History: [1:Cataracts, Sleep Apnea, Congestive Heart Failure] [2:Cataracts, Sleep Apnea, Congestive Heart Failure] [N/A:N/A] Date Acquired: [1:02/15/2018] [2:01/08/2018] [N/A:N/A] Weeks of Treatment: [1:0] [2:0] [N/A:N/A] Wound Status: [1:Open] [2:Open] [N/A:N/A] Measurements L x W x D [1:2.2x2x0.1] [2:1.2x1x0.3] [N/A:N/A] (cm) Area (cm) : [1:3.456] [2:0.942] [N/A:N/A] Volume (cm) : [1:0.346] [2:0.283] [N/A:N/A] Classification: [1:Category/Stage II] [2:Full Thickness Without Exposed Support Structures] [N/A:N/A] Exudate Amount: [1:Medium] [2:Large] [N/A:N/A] Exudate Type: [1:Serous] [2:Serous] [N/A:N/A] Exudate Color: [1:amber] [2:amber] [N/A:N/A] Wound Margin:  [1:Distinct, outline attached] [2:Distinct, outline attached] [N/A:N/A] Granulation Amount: [1:Medium (34-66%)] [2:Large (67-100%)] [N/A:N/A] Granulation Quality: [1:Pink] [2:Red, Hyper-granulation] [N/A:N/A] Necrotic Amount: [1:Medium (34-66%)] [2:Small (1-33%)] [N/A:N/A] Exposed Structures: [1:Fascia: No Fat Layer (Subcutaneous Tissue) Exposed: No Tendon: No Muscle: No Joint: No Bone: No] [2:Fascia: No Fat Layer (Subcutaneous Tissue) Exposed: No Tendon: No Muscle: No Joint: No Bone: No] [N/A:N/A] Epithelialization: [1:None] [2:None] [N/A:N/A] Debridement: [1:Debridement - Excisional] [2:N/A] [N/A:N/A] Pre-procedure [1:13:19] [2:N/A] [N/A:N/A] Verification/Time Out Taken: Pain Control: [1:Other] [2:N/A] [N/A:N/A] Tissue Debrided: [1:Subcutaneous, Slough] [2:N/A] [N/A:N/A] Level: [1:Skin/Subcutaneous Tissue] [2:N/A] [N/A:N/A] Debridement Area (sq cm): [1:4.4] [2:N/A] [N/A:N/A] Instrument: Curette N/A N/A Bleeding: Moderate N/A N/A Hemostasis Achieved: Pressure N/A N/A Procedural Pain: 3 N/A N/A Post Procedural Pain: 3 N/A N/A Debridement Treatment Procedure was tolerated well N/A N/A Response: Post Debridement 2.2x2x0.2 N/A N/A Measurements L x W x D (cm) Post Debridement Volume: 0.691 N/A N/A (cm) Post Debridement Stage: Category/Stage II N/A N/A Periwound Skin Texture: No Abnormalities Noted No Abnormalities Noted N/A Periwound Skin Moisture: No Abnormalities Noted  No Abnormalities Noted N/A Periwound Skin Color: No Abnormalities Noted No Abnormalities Noted N/A Temperature: No Abnormality No Abnormality N/A Tenderness on Palpation: Yes Yes N/A Wound Preparation: Ulcer Cleansing: Ulcer Cleansing: N/A Rinsed/Irrigated with Saline Rinsed/Irrigated with Saline Topical Anesthetic Applied: Topical Anesthetic Applied: Other: lidocaine 4% Other: lidocaine 4% Procedures Performed: Debridement CHEM CAUT GRANULATION N/A TISS Treatment Notes Electronic Signature(s) Signed:  03/01/2018 5:09:03 PM By: Linton Ham MD Entered By: Linton Ham on 03/01/2018 13:44:58 Staples, Lolly Mustache ([id number]) -------------------------------------------------------------------------------- Multi-Disciplinary Care Plan Details Patient Name: Lionel December. Date of Service: 03/01/2018 12:30 PM Medical Record Number: [id number] Patient Account Number: [id number] Date of Birth/Sex: Jul 05, 1923 (84 y.o. Male) Treating RN: Cornell Barman Primary Care Elyn Krogh: Caryl Bis, ERIC Other Clinician: Referring Dail Meece: Marin Olp Treating Job Holtsclaw/Extender: Tito Dine in Treatment: 0 Active Inactive Electronic Signature(s) Signed: 03/30/2018 3:29:59 PM By: Gretta Cool, BSN, RN, CWS, Kim RN, BSN Previous Signature: 03/01/2018 5:11:52 PM Version By: Gretta Cool, BSN, RN, CWS, Kim RN, BSN Entered By: Gretta Cool, BSN, RN, CWS, Kim on 03/30/2018 15:29:58 Lionel December ([id number]) -------------------------------------------------------------------------------- Pain Assessment Details Patient Name: Luzader, Robertlee C. Date of Service: 03/01/2018 12:30 PM Medical Record Number: [id number] Patient Account Number: [id number] Date of Birth/Sex: [birth year] (84 y.o. Male) Treating RN: Roger Shelter Primary Care Kamie Korber: Caryl Bis, ERIC Other Clinician: Referring Ty Oshima: Marin Olp Treating Reika Callanan/Extender: Ricard Dillon Weeks in Treatment: 0 Active Problems Location of Pain Severity and Description of Pain Patient Has Paino Yes Site Locations Pain Location: Pain in Ulcers Rate the pain. Current Pain Level: 5 Character of Pain Describe the Pain: Aching Pain Management and Medication Current Pain Management: Notes Topical or injectable lidocaine is offered to patient for acute pain when surgical debridement is performed. If needed, Patient is instructed to use over the counter pain medication for the following 24-48 hours after debridement. Wound care MDs do not prescribed pain  medications. Patient has chronic pain or uncontrolled pain. Patient has been instructed to make an appointment with their Primary Care Physician for pain management. Electronic Signature(s) Signed: 03/01/2018 4:34:44 PM By: Roger Shelter Entered By: Roger Shelter on 03/01/2018 12:48:21 Lionel December ([id number]) -------------------------------------------------------------------------------- Patient/Caregiver Education Details Patient Name: Lionel December Date of Service: 03/01/2018 12:30 PM Medical Record Number: [id number] Patient Account Number: [id number] Date of Birth/Gender: 1923/07/25 (84 y.o. Male) Treating RN: Montey Hora Primary Care Physician: Caryl Bis, ERIC Other Clinician: Referring Physician: Marin Olp Treating Physician/Extender: Tito Dine in Treatment: 0 Education Assessment Education Provided To: Caregiver SNF nurses Education Topics Provided Wound/Skin Impairment: Handouts: Other: wound care orders Methods: Printed Electronic Signature(s) Signed: 03/01/2018 4:39:21 PM By: Montey Hora Entered By: Montey Hora on 03/01/2018 14:04:51 Denomme, Lolly Mustache ([id number]) -------------------------------------------------------------------------------- Wound Assessment Details Patient Name: Teague, Trapper C. Date of Service: 03/01/2018 12:30 PM Medical Record Number: [id number] Patient Account Number: [id number] Date of Birth/Sex: March 13, 1923 (84 y.o. Male) Treating RN: Ahmed Prima Primary Care Dorleen Kissel: Caryl Bis, ERIC Other Clinician: Referring Uchechi Denison: Marin Olp Treating Kamoni Gentles/Extender: Ricard Dillon Weeks in Treatment: 0 Wound Status Wound Number: 1 Primary Pressure Ulcer Etiology: Wound Location: Back - Midline Wound Status: Open Wounding Event: Pressure Injury Comorbid Cataracts, Sleep Apnea, Congestive Heart Date Acquired: 02/15/2018 History: Failure Weeks Of Treatment: 0 Clustered Wound: No Photos Photo Uploaded  By: Roger Shelter on 03/01/2018 16:15:27 Wound Measurements Length: (cm) 2.2 Width: (cm) 2 Depth: (cm) 0.1 Area: (cm) 3.456 Volume: (cm) 0.346 % Reduction in Area: % Reduction in Volume: Epithelialization: None Tunneling: No Undermining: No Wound Description Classification: Category/Stage II Wound  Margin: Distinct, outline attached Exudate Amount: Medium Exudate Type: Serous Exudate Color: amber Foul Odor After Cleansing: No Slough/Fibrino Yes Wound Bed Granulation Amount: Medium (34-66%) Exposed Structure Granulation Quality: Pink Fascia Exposed: No Necrotic Amount: Medium (34-66%) Fat Layer (Subcutaneous Tissue) Exposed: No Necrotic Quality: Adherent Slough Tendon Exposed: No Muscle Exposed: No Joint Exposed: No Bone Exposed: No Periwound Skin Texture Eagleson, Tavin C. ([id number]) Texture Color No Abnormalities Noted: No No Abnormalities Noted: No Moisture Temperature / Pain No Abnormalities Noted: No Temperature: No Abnormality Tenderness on Palpation: Yes Wound Preparation Ulcer Cleansing: Rinsed/Irrigated with Saline Topical Anesthetic Applied: Other: lidocaine 4%, Electronic Signature(s) Signed: 03/01/2018 1:07:59 PM By: Alric Quan Entered By: Alric Quan on 03/01/2018 13:07:59 Schoenherr, Lolly Mustache ([id number]) -------------------------------------------------------------------------------- Wound Assessment Details Patient Name: Waynick, Lourdes C. Date of Service: 03/01/2018 12:30 PM Medical Record Number: [id number] Patient Account Number: [id number] Date of Birth/Sex: [birth year] (84 y.o. Male) Treating RN: Ahmed Prima Primary Care Karthik Whittinghill: Caryl Bis, ERIC Other Clinician: Referring Rylie Knierim: Marin Olp Treating Dezeray Puccio/Extender: Ricard Dillon Weeks in Treatment: 0 Wound Status Wound Number: 2 Primary Trauma, Other Etiology: Wound Location: Right Elbow Wound Status: Open Wounding Event: Trauma Comorbid Cataracts, Sleep Apnea,  Congestive Heart Date Acquired: 01/08/2018 History: Failure Weeks Of Treatment: 0 Clustered Wound: No Photos Photo Uploaded By: Roger Shelter on 03/01/2018 16:15:28 Wound Measurements Length: (cm) 1.2 Width: (cm) 1 Depth: (cm) 0.3 Area: (cm) 0.942 Volume: (cm) 0.283 % Reduction in Area: % Reduction in Volume: Epithelialization: None Tunneling: No Undermining: No Wound Description Full Thickness Without Exposed Support Classification: Structures Wound Margin: Distinct, outline attached Exudate Large Amount: Exudate Type: Serous Exudate Color: amber Foul Odor After Cleansing: No Slough/Fibrino No Wound Bed Granulation Amount: Large (67-100%) Exposed Structure Granulation Quality: Red, Hyper-granulation Fascia Exposed: No Necrotic Amount: Small (1-33%) Fat Layer (Subcutaneous Tissue) Exposed: No Necrotic Quality: Adherent Slough Tendon Exposed: No Muscle Exposed: No Joint Exposed: No Bone Exposed: No Delfin, Yiannis C. ([id number]) Periwound Skin Texture Texture Color No Abnormalities Noted: No No Abnormalities Noted: No Moisture Temperature / Pain No Abnormalities Noted: No Temperature: No Abnormality Tenderness on Palpation: Yes Wound Preparation Ulcer Cleansing: Rinsed/Irrigated with Saline Topical Anesthetic Applied: Other: lidocaine 4%, Electronic Signature(s) Signed: 03/01/2018 1:10:35 PM By: Alric Quan Entered By: Alric Quan on 03/01/2018 13:10:34 Ploch, Lolly Mustache ([id number]) -------------------------------------------------------------------------------- Vitals Details Patient Name: Lionel December. Date of Service: 03/01/2018 12:30 PM Medical Record Number: [id number] Patient Account Number: [id number] Date of Birth/Sex: May 06, 1923 (84 y.o. Male) Treating RN: Roger Shelter Primary Care Nikkita Adeyemi: Caryl Bis, ERIC Other Clinician: Referring Rhian Asebedo: Marin Olp Treating Camillia Marcy/Extender: Ricard Dillon Weeks in Treatment:  0 Vital Signs Time Taken: 12:48 Temperature (F): 97.6 Height (in): 72 Pulse (bpm): 79 Source: Stated Respiratory Rate (breaths/min): 18 Weight (lbs): 178 Blood Pressure (mmHg): 136/54 Source: Stated Reference Range: 80 - 120 mg / dl Body Mass Index (BMI): 24.1 Electronic Signature(s) Signed: 03/01/2018 4:34:44 PM By: Roger Shelter Entered By: Roger Shelter on 03/01/2018 12:49:11

## 2018-03-08 ENCOUNTER — Ambulatory Visit: Payer: Medicare Other | Admitting: Internal Medicine

## 2018-03-09 ENCOUNTER — Other Ambulatory Visit
Admission: RE | Admit: 2018-03-09 | Discharge: 2018-03-09 | Disposition: A | Discharge status: Home / Self Care | Payer: Medicare Other | Source: Skilled Nursing Facility | Attending: Internal Medicine | Admitting: Internal Medicine

## 2018-03-09 ENCOUNTER — Ambulatory Visit (HOSPITAL_COMMUNITY)
Admission: RE | Admit: 2018-03-09 | Discharge: 2018-03-09 | Disposition: A | Discharge status: Home / Self Care | Payer: Medicare Other | Source: Ambulatory Visit | Attending: Student | Admitting: Student

## 2018-03-09 DIAGNOSIS — X58XXXA Exposure to other specified factors, initial encounter: Secondary | ICD-10-CM | POA: Diagnosis not present

## 2018-03-09 DIAGNOSIS — X58XXXD Exposure to other specified factors, subsequent encounter: Secondary | ICD-10-CM | POA: Insufficient documentation

## 2018-03-09 DIAGNOSIS — S22081K Stable burst fracture of T11-T12 vertebra, subsequent encounter for fracture with nonunion: Secondary | ICD-10-CM | POA: Insufficient documentation

## 2018-03-09 DIAGNOSIS — S22089A Unspecified fracture of T11-T12 vertebra, initial encounter for closed fracture: Secondary | ICD-10-CM | POA: Diagnosis not present

## 2018-03-09 DIAGNOSIS — M5124 Other intervertebral disc displacement, thoracic region: Secondary | ICD-10-CM | POA: Insufficient documentation

## 2018-03-09 DIAGNOSIS — M546 Pain in thoracic spine: Secondary | ICD-10-CM | POA: Diagnosis not present

## 2018-03-09 DIAGNOSIS — Z950 Presence of cardiac pacemaker: Secondary | ICD-10-CM | POA: Insufficient documentation

## 2018-03-09 DIAGNOSIS — S22081A Stable burst fracture of T11-T12 vertebra, initial encounter for closed fracture: Secondary | ICD-10-CM | POA: Diagnosis present

## 2018-03-09 LAB — URINALYSIS, COMPLETE (UACMP) WITH MICROSCOPIC
BILIRUBIN URINE: NEGATIVE
GLUCOSE, UA: NEGATIVE mg/dL
HGB URINE DIPSTICK: NEGATIVE
KETONES UR: NEGATIVE mg/dL
Nitrite: POSITIVE — AB
Protein, ur: NEGATIVE mg/dL
Specific Gravity, Urine: 1.017 (ref 1.005–1.030)
WBC, UA: >50 WBC/hpf — ABNORMAL HIGH (ref 0–5)
pH: 5 (ref 5.0–8.0)

## 2018-03-11 LAB — URINE CULTURE

## 2018-03-14 ENCOUNTER — Other Ambulatory Visit
Admission: RE | Admit: 2018-03-14 | Discharge: 2018-03-14 | Disposition: A | Discharge status: Home / Self Care | Payer: Medicare Other | Source: Skilled Nursing Facility | Attending: Gerontology | Admitting: Gerontology

## 2018-03-14 ENCOUNTER — Non-Acute Institutional Stay (SKILLED_NURSING_FACILITY): Payer: Medicare Other | Admitting: Gerontology

## 2018-03-14 DIAGNOSIS — S22082G Unstable burst fracture of T11-T12 vertebra, subsequent encounter for fracture with delayed healing: Secondary | ICD-10-CM

## 2018-03-14 DIAGNOSIS — R197 Diarrhea, unspecified: Secondary | ICD-10-CM | POA: Insufficient documentation

## 2018-03-14 DIAGNOSIS — E46 Unspecified protein-calorie malnutrition: Secondary | ICD-10-CM | POA: Diagnosis not present

## 2018-03-14 DIAGNOSIS — S51011D Laceration without foreign body of right elbow, subsequent encounter: Secondary | ICD-10-CM

## 2018-03-14 DIAGNOSIS — F339 Major depressive disorder, recurrent, unspecified: Secondary | ICD-10-CM

## 2018-03-14 DIAGNOSIS — R410 Disorientation, unspecified: Secondary | ICD-10-CM | POA: Diagnosis not present

## 2018-03-14 DIAGNOSIS — L89100 Pressure ulcer of unspecified part of back, unstageable: Secondary | ICD-10-CM | POA: Diagnosis not present

## 2018-03-14 DIAGNOSIS — N3000 Acute cystitis without hematuria: Secondary | ICD-10-CM

## 2018-03-14 DIAGNOSIS — B999 Unspecified infectious disease: Secondary | ICD-10-CM | POA: Diagnosis not present

## 2018-03-14 LAB — COMPREHENSIVE METABOLIC PANEL
ALK PHOS: 80 U/L (ref 38–126)
ALT: 14 U/L — AB (ref 17–63)
AST: 23 U/L (ref 15–41)
Albumin: 2.8 g/dL — ABNORMAL LOW (ref 3.5–5.0)
Anion gap: 6 (ref 5–15)
BUN: 31 mg/dL — AB (ref 6–20)
CALCIUM: 9.7 mg/dL (ref 8.9–10.3)
CO2: 27 mmol/L (ref 22–32)
CREATININE: 1.61 mg/dL — AB (ref 0.61–1.24)
Chloride: 103 mmol/L (ref 101–111)
GFR, EST AFRICAN AMERICAN: 41 mL/min — AB (ref >60)
GFR, EST NON AFRICAN AMERICAN: 35 mL/min — AB (ref >60)
Glucose, Bld: 172 mg/dL — ABNORMAL HIGH (ref 65–99)
Potassium: 4.2 mmol/L (ref 3.5–5.1)
SODIUM: 136 mmol/L (ref 135–145)
Total Bilirubin: 0.2 mg/dL — ABNORMAL LOW (ref 0.3–1.2)
Total Protein: 6.1 g/dL — ABNORMAL LOW (ref 6.5–8.1)

## 2018-03-14 LAB — CBC WITH DIFFERENTIAL/PLATELET
Basophils Absolute: 0 10*3/uL (ref 0–0.1)
Basophils Relative: 0 %
Eosinophils Absolute: 0.1 10*3/uL (ref 0–0.7)
Eosinophils Relative: 1 %
HCT: 32.3 % — ABNORMAL LOW (ref 40.0–52.0)
HEMOGLOBIN: 11 g/dL — AB (ref 13.0–18.0)
LYMPHS ABS: 1.4 10*3/uL (ref 1.0–3.6)
LYMPHS PCT: 17 %
MCH: 29.8 pg (ref 26.0–34.0)
MCHC: 34 g/dL (ref 32.0–36.0)
MCV: 87.8 fL (ref 80.0–100.0)
Monocytes Absolute: 0.9 10*3/uL (ref 0.2–1.0)
Monocytes Relative: 11 %
NEUTROS PCT: 71 %
Neutro Abs: 5.7 10*3/uL (ref 1.4–6.5)
Platelets: 246 10*3/uL (ref 150–440)
RBC: 3.68 MIL/uL — ABNORMAL LOW (ref 4.40–5.90)
RDW: 16.5 % — ABNORMAL HIGH (ref 11.5–14.5)
WBC: 8 10*3/uL (ref 3.8–10.6)
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
## 2018-03-15 ENCOUNTER — Ambulatory Visit: Payer: Medicare Other | Admitting: Internal Medicine

## 2018-03-15 ENCOUNTER — Other Ambulatory Visit: Payer: Self-pay

## 2018-03-15 ENCOUNTER — Emergency Department
Admission: EM | Admit: 2018-03-15 | Discharge: 2018-03-15 | Disposition: A | Discharge status: Skilled Nursing Facility | Payer: Medicare Other | Attending: Emergency Medicine | Admitting: Emergency Medicine

## 2018-03-15 ENCOUNTER — Encounter
Admission: RE | Admit: 2018-03-15 | Discharge: 2018-03-15 | Disposition: A | Discharge status: Home / Self Care | Payer: Medicare Other | Source: Ambulatory Visit | Attending: Internal Medicine | Admitting: Internal Medicine

## 2018-03-15 ENCOUNTER — Encounter: Payer: Self-pay | Admitting: Emergency Medicine

## 2018-03-15 DIAGNOSIS — Z87891 Personal history of nicotine dependence: Secondary | ICD-10-CM | POA: Insufficient documentation

## 2018-03-15 DIAGNOSIS — N390 Urinary tract infection, site not specified: Secondary | ICD-10-CM | POA: Diagnosis not present

## 2018-03-15 DIAGNOSIS — E039 Hypothyroidism, unspecified: Secondary | ICD-10-CM | POA: Diagnosis not present

## 2018-03-15 DIAGNOSIS — R410 Disorientation, unspecified: Secondary | ICD-10-CM | POA: Diagnosis not present

## 2018-03-15 DIAGNOSIS — I2510 Atherosclerotic heart disease of native coronary artery without angina pectoris: Secondary | ICD-10-CM | POA: Diagnosis not present

## 2018-03-15 DIAGNOSIS — Z85828 Personal history of other malignant neoplasm of skin: Secondary | ICD-10-CM | POA: Insufficient documentation

## 2018-03-15 DIAGNOSIS — R451 Restlessness and agitation: Secondary | ICD-10-CM | POA: Diagnosis not present

## 2018-03-15 DIAGNOSIS — Z79899 Other long term (current) drug therapy: Secondary | ICD-10-CM | POA: Insufficient documentation

## 2018-03-15 DIAGNOSIS — Z951 Presence of aortocoronary bypass graft: Secondary | ICD-10-CM | POA: Insufficient documentation

## 2018-03-15 DIAGNOSIS — Z7982 Long term (current) use of aspirin: Secondary | ICD-10-CM | POA: Insufficient documentation

## 2018-03-15 DIAGNOSIS — N183 Chronic kidney disease, stage 3 (moderate): Secondary | ICD-10-CM | POA: Diagnosis not present

## 2018-03-15 DIAGNOSIS — I129 Hypertensive chronic kidney disease with stage 1 through stage 4 chronic kidney disease, or unspecified chronic kidney disease: Secondary | ICD-10-CM | POA: Insufficient documentation

## 2018-03-15 LAB — BASIC METABOLIC PANEL
ANION GAP: 8 (ref 5–15)
BUN: 26 mg/dL — ABNORMAL HIGH (ref 6–20)
CO2: 26 mmol/L (ref 22–32)
Calcium: 9.7 mg/dL (ref 8.9–10.3)
Chloride: 101 mmol/L (ref 101–111)
Creatinine, Ser: 1.46 mg/dL — ABNORMAL HIGH (ref 0.61–1.24)
GFR calc Af Amer: 46 mL/min — ABNORMAL LOW (ref >60)
GFR calc non Af Amer: 40 mL/min — ABNORMAL LOW (ref >60)
GLUCOSE: 145 mg/dL — AB (ref 65–99)
POTASSIUM: 4 mmol/L (ref 3.5–5.1)
Sodium: 135 mmol/L (ref 135–145)

## 2018-03-15 LAB — CBC WITH DIFFERENTIAL/PLATELET
BASOS ABS: 0 10*3/uL (ref 0–0.1)
Basophils Relative: 1 %
EOS PCT: 2 %
Eosinophils Absolute: 0.1 10*3/uL (ref 0–0.7)
HCT: 34 % — ABNORMAL LOW (ref 40.0–52.0)
Hemoglobin: 11.2 g/dL — ABNORMAL LOW (ref 13.0–18.0)
LYMPHS PCT: 20 %
Lymphs Abs: 1.4 10*3/uL (ref 1.0–3.6)
MCH: 28.8 pg (ref 26.0–34.0)
MCHC: 32.8 g/dL (ref 32.0–36.0)
MCV: 87.9 fL (ref 80.0–100.0)
Monocytes Absolute: 0.8 10*3/uL (ref 0.2–1.0)
Monocytes Relative: 11 %
NEUTROS ABS: 4.8 10*3/uL (ref 1.4–6.5)
Neutrophils Relative %: 66 %
Platelets: 276 10*3/uL (ref 150–440)
RBC: 3.87 MIL/uL — AB (ref 4.40–5.90)
RDW: 16.2 % — ABNORMAL HIGH (ref 11.5–14.5)
WBC: 7.3 10*3/uL (ref 3.8–10.6)

## 2018-03-15 LAB — TROPONIN I: TROPONIN I: 0.03 ng/mL — AB (ref <0.03)

## 2018-03-15 MED ORDER — HALOPERIDOL LACTATE 5 MG/ML IJ SOLN: 2.5000 mg | Freq: Four times a day (QID) | INTRAMUSCULAR | 0 refills | Status: DC | PRN

## 2018-03-15 MED ORDER — LORAZEPAM 2 MG/ML IJ SOLN: 1.0000 mg | Freq: Once | INTRAMUSCULAR | Status: DC

## 2018-03-15 MED ORDER — HALOPERIDOL LACTATE 5 MG/ML IJ SOLN
2.5000 mg | Freq: Once | INTRAMUSCULAR | Status: AC
  Administered 2018-03-15: 2.5 mg via INTRAVENOUS
  Filled 2018-03-15: qty 1

## 2018-03-15 MED ORDER — LORAZEPAM 2 MG/ML IJ SOLN
1.0000 mg | Freq: Once | INTRAMUSCULAR | Status: AC
  Administered 2018-03-15: 1 mg via INTRAMUSCULAR
  Filled 2018-03-15: qty 1
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
## 2018-03-15 NOTE — Discharge Instructions (Addendum)
It is okay for Cody Blackburn's bed to be angled up to 30 degrees during meals and medication administration. However it is very important that he be pulled up in the bed so that the flexing occurs at the hips and not the spine.

## 2018-03-15 NOTE — ED Notes (Signed)
Pt had small BM, pt changed and peri care given, barrier cream also applied to pt.

## 2018-03-15 NOTE — ED Provider Notes (Signed)
[facility] Emergency Department Provider Note  ____________________________________________   I have reviewed the triage vital signs and the nursing notes.   HISTORY  Chief Complaint Recurrent UTI   History limited by: Not Limited, some history obtained from family  HPI RIAAN TOLEDO is a 84 y.o. male who presents to the emergency department today from [facility] for multiple issues and possible admission. Per family patient has been having a difficult recovery from a thoracic spine fracture. It has not been healing appropriately and the patient was told to now be on strict bed rest. Additionally the patient was recently diagnosed with a UTI. Initially was started on an oral antibiotic however was switched to IV rocephin last night. Further the patient has been more agitated the past couple of days. Per medication list it does not appear patient has been given any medication PRN for agitation. It does sound like [place] was having a hard time getting an appropriate bed.    Per medical record review patient has a history of T12 fracture with poor healing.   Past Medical History:  Diagnosis Date  . Acquired hypothyroidism   . Anxiety and depression   . Benign essential tremor   . Borderline diabetes mellitus   . BPH (benign prostatic hyperplasia)   . Coronary artery disease   . Depression   . Essential hypertension   . GERD (gastroesophageal reflux disease)   . History of CVA (cerebrovascular accident) 11/2013  . Hypertension   . Incomplete bladder emptying   . Obstructive sleep apnea   . Panic attacks   . Peptic ulcer disease   . Pure hypercholesterolemia   . Skin cancer    In remission for several years; bianual dermatologist apponitments  . Sleep apnea   . Stomach ulcer   . TIA (transient ischemic attack)     Patient Active Problem List   Diagnosis Date Noted  . Unstageable pressure ulcer of back ([place]) 03/14/2018  . Protein-calorie malnutrition  ([place]) 03/14/2018  . Burst fracture of T12 vertebra with routine healing 03/01/2018  . Urinary retention 03/01/2018  . Chronic LUQ pain 10/21/2017  . PVD (peripheral vascular disease) ([place]) 10/18/2017  . Thyroid nodule 10/18/2017  . Dysphagia 10/18/2017  . Epigastric pain 10/18/2017  . Squamous cell carcinoma 10/18/2017  . Exhaustion 04/20/2017  . Overweight 04/20/2017  . DDD (degenerative disc disease), lumbar 03/07/2017  . Primary osteoarthritis of both hips 03/07/2017  . Anemia of chronic disease 10/28/2016  . Kidney disease 01/23/2016  . Pain in lower limb 01/23/2016  . Pain in right knee 01/23/2016  . Strain of hamstring muscle 01/23/2016  . Anxiety 10/31/2015  . Borderline diabetes mellitus 10/31/2015  . Recurrent major depression in remission ([place]) 10/31/2015  . Blood glucose elevated 08/06/2015  . Benign essential tremor 04/18/2015  . Borderline diabetes 04/18/2015  . Clinical depression 04/18/2015  . Cerebrovascular accident, old 04/18/2015  . Adult hypothyroidism 04/18/2015  . Obstructive apnea 04/18/2015  . Panic attack 04/18/2015  . Gastroduodenal ulcer 04/18/2015  . Dizziness 03/18/2015  . Abnormal vision as late effect of cerebrovascular disease 02/26/2015  . Anxiety and depression 02/15/2015  . BPH with obstruction/lower urinary tract symptoms 02/15/2015  . Acid reflux 02/15/2015  . Incomplete bladder emptying 02/15/2015  . CA of skin 02/15/2015  . Apnea, sleep 02/15/2015  . Gastric ulcer 02/15/2015  . Temporary cerebral vascular dysfunction 02/15/2015  . Chronic kidney disease (CKD), stage III (moderate) ([place]) 09/25/2014  . Benign hypertension 02/20/2014  . 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 CAD in native artery 02/20/2014  . Hypercholesterolemia without hypertriglyceridemia 02/20/2014  . Pure hypercholesterolemia 02/20/2014    Past Surgical History:  Procedure Laterality Date  . CARDIAC SURGERY  2005  .  [place]  . CAROTID ENDARTERECTOMY    . COLONOSCOPY  11/11/2008   32mm polyp in sigmoid colon, internal non-bleeding small hemorrhoids, diverticulosis in sigmoid, descending, and transverse colon  . CORONARY ARTERY BYPASS GRAFT    . ESOPHAGOGASTRODUODENOSCOPY (EGD) WITH PROPOFOL N/A 11/22/2017   Procedure: ESOPHAGOGASTRODUODENOSCOPY (EGD) WITH PROPOFOL;  Surgeon: Jonathon Bellows, MD;  Location: [facility] ENDOSCOPY;  Service: Gastroenterology;  Laterality: N/A;  . SQUAMOUS CELL CARCINOMA EXCISION     removal from nose    Prior to Admission medications   Medication Sig Start Date End Date Taking? Authorizing Provider  acetaminophen (TYLENOL) 325 MG tablet Take 975 mg by mouth 3 (three) times daily.     [provider]  acetaminophen (TYLENOL) 650 MG suppository Place 650 mg rectally every 4 (four) hours as needed. Give 1 suppository Q 4 hours for fever if unable to take med PO. Maximum dose for 24 hours is 3,000 mg for all sources of Acetaminophen/ Tylenol    [provider]  Amino Acids-Protein Hydrolys (FEEDING SUPPLEMENT, PRO-STAT SUGAR FREE 64,) LIQD Take 30 mLs by mouth 2 (two) times daily. low protein, low albumin, wound healing    [provider]  amLODipine (NORVASC) 10 MG tablet Take 10 mg by mouth daily.    [provider]  Artificial Saliva (BIOTENE DRY MOUTH MOISTURIZING MT) Use as directed 30 mLs in the mouth or throat 2 (two) times daily. Rinse mouth with 15-30 mL. Swish and spit.  for mouth moisture    [provider]  Artificial Saliva (BIOTENE MOISTURIZING MOUTH) SOLN Use as directed 2 sprays in the mouth or throat every 4 (four) hours as needed. for mouth moisture. OK to leave at bedside    [provider]  aspirin 325 MG EC tablet Take 325 mg by mouth 2 (two) times daily.     [provider]  Calcipotriene 0.005 % solution Apply topically Qd to BID Monday through Thursday 02/24/17   [provider]   calcium-vitamin D (OSCAL WITH D) 500-200 MG-UNIT tablet Take 1 tablet by mouth daily.    [provider]  carvedilol (COREG) 3.125 MG tablet TAKE 1 TABLET TWICE DAILY WITH MEALS 11/30/17   [name], MD  cefTRIAXone (ROCEPHIN) IVPB Inject 1 g into the vein daily. x 10 days for UTI 03/14/18 03/23/18  [provider]  Cholecalciferol 4000 units CAPS Take 1 capsule by mouth daily.    [provider]  Clobetasol Prop Emollient Base 0.05 % emollient cream Apply topically to scalp once a  day on Sun, Fri, Sat -as needed for scalp irritation/itching    [provider]  Coenzyme Q10 (CO Q 10) 100 MG CAPS Take 2 capsules by mouth daily.     [provider]  finasteride (PROSCAR) 5 MG tablet TAKE ONE TABLET BY MOUTH EVERY DAY 11/27/17   McGowan, Larene Beach A, PA-C  fluticasone (FLONASE) 50 MCG/ACT nasal spray INSTILL 2 SPRAYS INTO EACH NOSTRIL ONCE DAILY 09/01/17   [name], MD  GLUCERNA (GLUCERNA) LIQD Take 237 mLs by mouth 2 (two) times daily between meals.    [provider]  levothyroxine (SYNTHROID, LEVOTHROID) 88 MCG tablet TAKE 1 TABLET BY MOUTH EVERY DAY 12/23/17  [name], MD  Lidocaine (ASPERCREME LIDOCAINE) 4 % PTCH Apply 1 patch topically daily. Apply patch to the level of T12 for pain (r/t fracture). Remove patch after 12 hours.    [provider]  loratadine (CLARITIN) 10 MG tablet Take 10 mg by mouth daily.     [provider]  losartan (COZAAR) 50 MG tablet TAKE ONE TABLET BY MOUTH TWICE DAILY 09/19/17   [name], MD  Melatonin 3 MG TABS Take 6 mg by mouth daily. 2 tabs    [provider]  methocarbamol (ROBAXIN) 500 MG tablet Take 500 mg by mouth every 6 (six) hours as needed (pain, spasms, cramps).    [provider]  metoCLOPramide (REGLAN) 5 MG tablet Take 5 mg by mouth 3 (three) times daily.    [provider]  Multiple Vitamins-Minerals (CEROVITE SENIOR) TABS  Take 1 tablet by mouth daily.    [provider]  nitroGLYCERIN (NITROSTAT) 0.4 MG SL tablet 1 under tongue every 5 minutes as needed for chest pain, up to 3 doses, notify MD    [provider]  omeprazole (PRILOSEC) 40 MG capsule Take 40 mg by mouth daily.    [provider]  ondansetron (ZOFRAN) 4 MG tablet Take 4 mg by mouth every 4 (four) hours as needed for nausea or vomiting.     [provider]  polycarbophil (FIBERCON) 625 MG tablet Take 1,250 mg by mouth daily.     [provider]  polyethylene glycol (MIRALAX / GLYCOLAX) packet Take 17 g by mouth daily. Mix in 4-8oz of fluid for constipation    [provider]  protein supplement (RESOURCE BENEPROTEIN) POWD Take 6 g by mouth 2 (two) times daily. Mix in 4-6 ounces fluid or other soft food BID for nutritional supplement    [provider]  ranitidine (ZANTAC) 75 MG tablet Take 75 mg by mouth 2 (two) times daily.    [provider]  risperiDONE (RISPERDAL) 0.25 MG tablet Take 0.25 mg by mouth at bedtime.    [provider]  sennosides (SENOKOT) 8.8 MG/5ML syrup Take 10 mLs by mouth 2 (two) times daily as needed.     [provider]  sertraline (ZOLOFT) 25 MG tablet Take 25 mg by mouth daily.     [provider]  simethicone (MYLICON) 80 MG chewable tablet Chew 80 mg by mouth 4 (four) times daily as needed for flatulence.    [provider]  sodium chloride (OCEAN) 0.65 % SOLN nasal spray Place 1 spray into both nostrils every 2 (two) hours as needed. dry nose    [provider]  tamsulosin (FLOMAX) 0.4 MG CAPS capsule Take 0.4 mg by mouth 2 (two) times daily.    [provider]  traMADol (ULTRAM) 50 MG tablet Take 50 mg by mouth every 4 (four) hours as needed.    [provider]  traMADol (ULTRAM) 50 MG tablet Take 25 mg by mouth 3 (three) times daily. 1/2 tab    [provider]    Allergies Baycol   [cerivastatin] and Zocor  [simvastatin]  Family History  Problem Relation Age of Onset  . Aneurysm Mother   . Stroke Father   . Lymphoma Sister   . Cancer Neg Hx        Bladder, [place] and Prostate  . Prostate cancer Neg Hx     Social History Social History   Tobacco Use  . Smoking status: Former Smoker  Years: 30.00    Types: Cigarettes    Last attempt to quit: 11/15/1968    Years since quitting: 49.3  . Smokeless tobacco: Never Used  Substance Use Topics  . Alcohol use: No    Frequency: Never  . Drug use: No    Review of Systems Constitutional: No fever/chills Eyes: No visual changes. ENT: No sore throat. Cardiovascular: Denies chest pain. Respiratory: Denies shortness of breath. Gastrointestinal: No abdominal pain.  No nausea, no vomiting.  No diarrhea.   Genitourinary: Positive for foley catheter Musculoskeletal: Positive for thoracic fracture Skin: Positive for pressure wounds to the back. Neurological: Positive for agitation.  ____________________________________________   PHYSICAL EXAM:  VITAL SIGNS: ED Triage Vitals [03/15/18 1641]  Enc Vitals Group     BP (!) 143/70     Pulse Rate 92     Resp 20     Temp 98.5 F (36.9 C)     Temp Source Oral     SpO2 97 %   Constitutional: Awake and alert. Slightly agitated. Eyes: Conjunctivae are normal.  ENT   Head: Normocephalic and atraumatic.   Nose: No congestion/rhinnorhea.   Mouth/Throat: Mucous membranes are moist.   Neck: No stridor. Hematological/Lymphatic/Immunilogical: No cervical lymphadenopathy. Cardiovascular: Normal rate, regular rhythm.  No murmurs, rubs, or gallops.  Respiratory: Normal respiratory effort without tachypnea nor retractions. Breath sounds are clear and equal bilaterally. No wheezes/rales/rhonchi. Gastrointestinal: Soft and non tender. No rebound. No guarding.  Genitourinary: Deferred Musculoskeletal: Normal range of motion in all extremities. No lower extremity  edema. Neurologic:  Normal speech and language. No gross focal neurologic deficits are appreciated.  Skin:  Skin is warm, dry and intact. No rash noted. Psychiatric: Slight agitation  ____________________________________________    LABS (pertinent positives/negatives)  CBC wbc 7.3, hgb 11.2, plt 276 BMP na 135, k 4.0, glu 145, cr 1.46 Trop 0.03  ____________________________________________   EKG  None  ____________________________________________    RADIOLOGY  None  ____________________________________________   PROCEDURES  Procedures  ____________________________________________   INITIAL IMPRESSION / ASSESSMENT AND PLAN / ED COURSE  Pertinent labs & imaging results that were available during my care of the patient were reviewed by me and considered in my medical decision making (see chart for details).  Patient presented to the emergency department today from [facility] with multiple issues. In terms of UTI patient does not appear to be septic and patient can and has already started to receive IV antibiotics at [facility]. In terms of agitation he did seem to respond decently well to Haldol.  They have not yet tried any agitation medication.  In terms of them not having a bed I did speak with Dr. Cari Caraway with neurosurgery.  He is okay to be tilted up in bed as long as the spine is kept straight in the flexion occurs at the hips.  I discussed this with family.  At this point do not feel there would be much added benefit by admitting of the hospital versus continued care at [facility].  Will discharge back to [facility].  ____________________________________________   FINAL CLINICAL IMPRESSION(S) / ED DIAGNOSES  Final diagnoses:  Lower urinary tract infection  Delirium  Agitation     Note: This dictation was prepared with Dragon dictation. Any transcriptional errors that result from this process are unintentional     Nance Pear, MD 03/16/18 1625

## 2018-03-15 NOTE — ED Notes (Signed)
EMS here to take pt back to [facility]

## 2018-03-15 NOTE — ED Notes (Signed)
Family notified of DC plan and DC instructions went over with family.

## 2018-03-17 ENCOUNTER — Other Ambulatory Visit: Payer: Self-pay

## 2018-03-17 ENCOUNTER — Emergency Department: Payer: Medicare Other

## 2018-03-17 ENCOUNTER — Emergency Department
Admission: EM | Admit: 2018-03-17 | Discharge: 2018-03-17 | Disposition: A | Discharge status: Home / Self Care | Payer: Medicare Other | Attending: Emergency Medicine | Admitting: Emergency Medicine

## 2018-03-17 DIAGNOSIS — Y92122 Bedroom in nursing home as the place of occurrence of the external cause: Secondary | ICD-10-CM | POA: Diagnosis not present

## 2018-03-17 DIAGNOSIS — Z7982 Long term (current) use of aspirin: Secondary | ICD-10-CM | POA: Diagnosis not present

## 2018-03-17 DIAGNOSIS — W19XXXA Unspecified fall, initial encounter: Secondary | ICD-10-CM

## 2018-03-17 DIAGNOSIS — Z8673 Personal history of transient ischemic attack (TIA), and cerebral infarction without residual deficits: Secondary | ICD-10-CM | POA: Insufficient documentation

## 2018-03-17 DIAGNOSIS — Y998 Other external cause status: Secondary | ICD-10-CM | POA: Diagnosis not present

## 2018-03-17 DIAGNOSIS — Z951 Presence of aortocoronary bypass graft: Secondary | ICD-10-CM | POA: Insufficient documentation

## 2018-03-17 DIAGNOSIS — Z79899 Other long term (current) drug therapy: Secondary | ICD-10-CM | POA: Insufficient documentation

## 2018-03-17 DIAGNOSIS — I129 Hypertensive chronic kidney disease with stage 1 through stage 4 chronic kidney disease, or unspecified chronic kidney disease: Secondary | ICD-10-CM | POA: Insufficient documentation

## 2018-03-17 DIAGNOSIS — Y9389 Activity, other specified: Secondary | ICD-10-CM | POA: Insufficient documentation

## 2018-03-17 DIAGNOSIS — F0390 Unspecified dementia without behavioral disturbance: Secondary | ICD-10-CM | POA: Diagnosis not present

## 2018-03-17 DIAGNOSIS — Z87891 Personal history of nicotine dependence: Secondary | ICD-10-CM | POA: Diagnosis not present

## 2018-03-17 DIAGNOSIS — N183 Chronic kidney disease, stage 3 (moderate): Secondary | ICD-10-CM | POA: Diagnosis not present

## 2018-03-17 DIAGNOSIS — W06XXXA Fall from bed, initial encounter: Secondary | ICD-10-CM | POA: Diagnosis not present

## 2018-03-17 DIAGNOSIS — I2510 Atherosclerotic heart disease of native coronary artery without angina pectoris: Secondary | ICD-10-CM | POA: Diagnosis not present

## 2018-03-17 DIAGNOSIS — S0990XA Unspecified injury of head, initial encounter: Secondary | ICD-10-CM | POA: Diagnosis present

## 2018-03-17 DIAGNOSIS — E039 Hypothyroidism, unspecified: Secondary | ICD-10-CM | POA: Diagnosis not present

## 2018-03-17 LAB — BASIC METABOLIC PANEL
ANION GAP: 6 (ref 5–15)
BUN: 31 mg/dL — ABNORMAL HIGH (ref 6–20)
CALCIUM: 10.4 mg/dL — AB (ref 8.9–10.3)
CO2: 27 mmol/L (ref 22–32)
Chloride: 103 mmol/L (ref 101–111)
Creatinine, Ser: 1.58 mg/dL — ABNORMAL HIGH (ref 0.61–1.24)
GFR, EST AFRICAN AMERICAN: 42 mL/min — AB (ref >60)
GFR, EST NON AFRICAN AMERICAN: 36 mL/min — AB (ref >60)
GLUCOSE: 139 mg/dL — AB (ref 65–99)
Potassium: 3.8 mmol/L (ref 3.5–5.1)
SODIUM: 136 mmol/L (ref 135–145)

## 2018-03-17 LAB — URINALYSIS, COMPLETE (UACMP) WITH MICROSCOPIC
Bacteria, UA: NONE SEEN
Bilirubin Urine: NEGATIVE
Glucose, UA: NEGATIVE mg/dL
Hgb urine dipstick: NEGATIVE
Ketones, ur: NEGATIVE mg/dL
Leukocytes, UA: NEGATIVE
NITRITE: NEGATIVE
PH: 6 (ref 5.0–8.0)
Protein, ur: 100 mg/dL — AB
SPECIFIC GRAVITY, URINE: 1.014 (ref 1.005–1.030)

## 2018-03-17 LAB — CBC WITH DIFFERENTIAL/PLATELET
BASOS ABS: 0 10*3/uL (ref 0–0.1)
BASOS PCT: 1 %
EOS ABS: 0.2 10*3/uL (ref 0–0.7)
EOS PCT: 2 %
HCT: 38.1 % — ABNORMAL LOW (ref 40.0–52.0)
Hemoglobin: 12.5 g/dL — ABNORMAL LOW (ref 13.0–18.0)
Lymphocytes Relative: 19 %
Lymphs Abs: 1.4 10*3/uL (ref 1.0–3.6)
MCH: 28.9 pg (ref 26.0–34.0)
MCHC: 32.9 g/dL (ref 32.0–36.0)
MCV: 87.7 fL (ref 80.0–100.0)
Monocytes Absolute: 0.9 10*3/uL (ref 0.2–1.0)
Monocytes Relative: 12 %
Neutro Abs: 5 10*3/uL (ref 1.4–6.5)
Neutrophils Relative %: 66 %
PLATELETS: 299 10*3/uL (ref 150–440)
RBC: 4.35 MIL/uL — AB (ref 4.40–5.90)
RDW: 16.2 % — AB (ref 11.5–14.5)
WBC: 7.6 10*3/uL (ref 3.8–10.6)

## 2018-03-17 MED ORDER — LOSARTAN POTASSIUM 50 MG PO TABS
50.0000 mg | ORAL_TABLET | Freq: Once | ORAL | Status: AC
  Administered 2018-03-17: 50 mg via ORAL
  Filled 2018-03-17: qty 1

## 2018-03-17 MED ORDER — SODIUM CHLORIDE 0.9 % IV BOLUS
1000.0000 mL | Freq: Once | INTRAVENOUS | Status: AC
  Administered 2018-03-17: 1000 mL via INTRAVENOUS

## 2018-03-17 MED ORDER — RISPERIDONE 0.25 MG PO TABS: 0.2500 mg | ORAL_TABLET | Freq: Two times a day (BID) | ORAL | 0 refills | Status: DC | PRN

## 2018-03-17 MED ORDER — AMLODIPINE BESYLATE 5 MG PO TABS
10.0000 mg | ORAL_TABLET | Freq: Once | ORAL | Status: AC
  Administered 2018-03-17: 10 mg via ORAL
  Filled 2018-03-17: qty 2

## 2018-03-17 MED ORDER — CARVEDILOL 6.25 MG PO TABS
3.1250 mg | ORAL_TABLET | Freq: Once | ORAL | Status: AC
  Administered 2018-03-17: 3.125 mg via ORAL
  Filled 2018-03-17: qty 1

## 2018-03-17 NOTE — ED Triage Notes (Addendum)
Pt comes into the ED via EMS from [facility] with c/o unwitnessed fall this morning ,states the pt is confused but this is his baseline. States he had a sitter who at the time of the fall was not present. Pt has a foley catheter in place on arrival. Pt is orient to self..pt has a 22G IV in the left arm when flushed resistance noted with edema. IV discontinued.

## 2018-03-17 NOTE — ED Notes (Signed)
Ambulance present to take pt home. Pt has been cleaned and changed. IV removed and family remained at bedside to ensure pt was stable for transportation.

## 2018-03-17 NOTE — ED Provider Notes (Signed)
[facility] Emergency Department Provider Note  ____________________________________________  Time seen: Approximately 7:27 AM  I have reviewed the triage vital signs and the nursing notes.   HISTORY  Chief Complaint Fall  Level 5 caveat:  Portions of the history and physical were unable to be obtained due to dementia   HPI Cody Blackburn is a 84 y.o. male with a history of hypothyroidism, hypertension, CAD, CVA, traumatic subarachnoid hemorrhage, unstable unhealed T12 fracture who presents for evaluation of a fall.  Patient is currently at [facility] after having a fall a month ago causing an unstable T12 fracture and small traumatic subarachnoid hemorrhage. Patient is currently suppose to be bedbound due to unhealed T12 fracture and does not need to use his brace unless he is standing up.  Patient has an aid who sits with him to make sure he won't get up from bed however that person left the room.  As soon as she did he try to get up and fell.  Patient reports falling on his buttock but hitting his head on a nightstand.  He denies LOC.  Is not on blood thinners.  Patient has no further complaints. Patient has a Foley catheter and is currently on Iv antibiotics for UTI.  Patient is slightly confused which is his baseline per son was at the bedside.  Past Medical History:  Diagnosis Date  . Acquired hypothyroidism   . Anxiety and depression   . Benign essential tremor   . Borderline diabetes mellitus   . BPH (benign prostatic hyperplasia)   . Coronary artery disease   . Depression   . Essential hypertension   . GERD (gastroesophageal reflux disease)   . History of CVA (cerebrovascular accident) 11/2013  . Hypertension   . Incomplete bladder emptying   . Obstructive sleep apnea   . Panic attacks   . Peptic ulcer disease   . Pure hypercholesterolemia   . Skin cancer    In remission for several years; bianual dermatologist apponitments  . Sleep apnea   .  Stomach ulcer   . TIA (transient ischemic attack)     Patient Active Problem List   Diagnosis Date Noted  . Unstageable pressure ulcer of back ([place]) 03/14/2018  . Protein-calorie malnutrition ([place]) 03/14/2018  . Burst fracture of T12 vertebra with routine healing 03/01/2018  . Urinary retention 03/01/2018  . Chronic LUQ pain 10/21/2017  . PVD (peripheral vascular disease) ([place]) 10/18/2017  . Thyroid nodule 10/18/2017  . Dysphagia 10/18/2017  . Epigastric pain 10/18/2017  . Squamous cell carcinoma 10/18/2017  . Exhaustion 04/20/2017  . Overweight 04/20/2017  . DDD (degenerative disc disease), lumbar 03/07/2017  . Primary osteoarthritis of both hips 03/07/2017  . Anemia of chronic disease 10/28/2016  . Kidney disease 01/23/2016  . Pain in lower limb 01/23/2016  . Pain in right knee 01/23/2016  . Strain of hamstring muscle 01/23/2016  . Anxiety 10/31/2015  . Borderline diabetes mellitus 10/31/2015  . Recurrent major depression in remission ([place]) 10/31/2015  . Blood glucose elevated 08/06/2015  . Benign essential tremor 04/18/2015  . Borderline diabetes 04/18/2015  . Clinical depression 04/18/2015  . Cerebrovascular accident, old 04/18/2015  . Adult hypothyroidism 04/18/2015  . Obstructive apnea 04/18/2015  . Panic attack 04/18/2015  . Gastroduodenal ulcer 04/18/2015  . Dizziness 03/18/2015  . Abnormal vision as late effect of cerebrovascular disease 02/26/2015  . Anxiety and depression 02/15/2015  . BPH with obstruction/lower urinary tract symptoms 02/15/2015  . Acid reflux 02/15/2015  .  Incomplete bladder emptying 02/15/2015  . CA of skin 02/15/2015  . Apnea, sleep 02/15/2015  . Gastric ulcer 02/15/2015  . Temporary cerebral vascular dysfunction 02/15/2015  . Chronic kidney disease (CKD), stage III (moderate) ([place]) 09/25/2014  . Benign hypertension 02/20/2014  . 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 CAD in native artery  02/20/2014  . Hypercholesterolemia without hypertriglyceridemia 02/20/2014  . Pure hypercholesterolemia 02/20/2014    Past Surgical History:  Procedure Laterality Date  . CARDIAC SURGERY  2005  . [place]  . CAROTID ENDARTERECTOMY    . COLONOSCOPY  11/11/2008   54mm polyp in sigmoid colon, internal non-bleeding small hemorrhoids, diverticulosis in sigmoid, descending, and transverse colon  . CORONARY ARTERY BYPASS GRAFT    . ESOPHAGOGASTRODUODENOSCOPY (EGD) WITH PROPOFOL N/A 11/22/2017   Procedure: ESOPHAGOGASTRODUODENOSCOPY (EGD) WITH PROPOFOL;  Surgeon: Jonathon Bellows, MD;  Location: [facility] ENDOSCOPY;  Service: Gastroenterology;  Laterality: N/A;  . SQUAMOUS CELL CARCINOMA EXCISION     removal from nose    Prior to Admission medications   Medication Sig Start Date End Date Taking? Authorizing Provider  acetaminophen (TYLENOL) 325 MG tablet Take 975 mg by mouth 3 (three) times daily.     [provider]  acetaminophen (TYLENOL) 650 MG suppository Place 650 mg rectally every 4 (four) hours as needed. Give 1 suppository Q 4 hours for fever if unable to take med PO. Maximum dose for 24 hours is 3,000 mg for all sources of Acetaminophen/ Tylenol    [provider]  Amino Acids-Protein Hydrolys (FEEDING SUPPLEMENT, PRO-STAT SUGAR FREE 64,) LIQD Take 30 mLs by mouth 2 (two) times daily. low protein, low albumin, wound healing    [provider]  amLODipine (NORVASC) 10 MG tablet Take 10 mg by mouth daily.    [provider]  Artificial Saliva (BIOTENE DRY MOUTH MOISTURIZING MT) Use as directed 30 mLs in the mouth or throat 2 (two) times daily. Rinse mouth with 15-30 mL. Swish and spit.  for mouth moisture    [provider]  Artificial Saliva (BIOTENE MOISTURIZING MOUTH) SOLN Use as directed 2 sprays in the mouth or throat every 4 (four) hours as needed. for mouth moisture. OK to leave at bedside    [provider]  aspirin 325 MG EC  tablet Take 325 mg by mouth 2 (two) times daily.     [provider]  Calcipotriene 0.005 % solution Apply topically Qd to BID Monday through Thursday 02/24/17   [provider]  calcium-vitamin D (OSCAL WITH D) 500-200 MG-UNIT tablet Take 1 tablet by mouth daily.    [provider]  carvedilol (COREG) 3.125 MG tablet TAKE 1 TABLET TWICE DAILY WITH MEALS 11/30/17   [name], MD  cefTRIAXone (ROCEPHIN) IVPB Inject 1 g into the vein daily. x 10 days for UTI 03/14/18 03/23/18  [provider]  Cholecalciferol 4000 units CAPS Take 1 capsule by mouth daily.    [provider]  Clobetasol Prop Emollient Base 0.05 % emollient cream Apply topically to scalp once a  day on Sun, Fri, Sat -as needed for scalp irritation/itching    [provider]  Coenzyme Q10 (CO Q 10) 100 MG CAPS Take 2 capsules by mouth daily.     [provider]  collagenase (SANTYL) ointment Apply 1 application topically every evening.    [provider]  finasteride (PROSCAR) 5 MG tablet TAKE ONE TABLET BY MOUTH EVERY DAY 11/27/17  Zara Council A, PA-C  fluticasone (FLONASE) 50 MCG/ACT nasal spray INSTILL 2 SPRAYS INTO EACH NOSTRIL ONCE DAILY 09/01/17   [name], MD  geriatric multivitamins-minerals (ELDERTONIC/GEVRABON) LIQD Take 15 mLs by mouth 3 (three) times daily with meals.    [provider]  GLUCERNA (GLUCERNA) LIQD Take 237 mLs by mouth 2 (two) times daily between meals.    [provider]  haloperidol lactate (HALDOL) 5 MG/ML injection Inject 0.5 mLs (2.5 mg total) into the vein every 6 (six) hours as needed (Agitation). 03/15/18   Nance Pear, MD  levothyroxine (SYNTHROID, LEVOTHROID) 88 MCG tablet TAKE 1 TABLET BY MOUTH EVERY DAY 12/23/17   [name], MD  Lidocaine (ASPERCREME LIDOCAINE) 4 % PTCH Apply 1 patch topically daily. Apply patch to the level of T12 for pain (r/t fracture). Remove patch after 12 hours.     [provider]  loratadine (CLARITIN) 10 MG tablet Take 10 mg by mouth daily.     [provider]  losartan (COZAAR) 50 MG tablet TAKE ONE TABLET BY MOUTH TWICE DAILY 09/19/17   [name], MD  Melatonin 3 MG TABS Take 2 tablets by mouth at bedtime.     [provider]  methocarbamol (ROBAXIN) 500 MG tablet Take 500 mg by mouth every 6 (six) hours as needed (pain, spasms, cramps).    [provider]  metoCLOPramide (REGLAN) 5 MG tablet Take 5 mg by mouth 3 (three) times daily.    [provider]  Multiple Vitamins-Minerals (CEROVITE SENIOR) TABS Take 1 tablet by mouth daily.    [provider]  nitroGLYCERIN (NITROSTAT) 0.4 MG SL tablet 1 under tongue every 5 minutes as needed for chest pain, up to 3 doses, notify MD    [provider]  omeprazole (PRILOSEC) 40 MG capsule Take 40 mg by mouth daily.    [provider]  ondansetron (ZOFRAN) 4 MG tablet Take 4 mg by mouth every 4 (four) hours as needed for nausea or vomiting.     [provider]  polycarbophil (FIBERCON) 625 MG tablet Take 1,250 mg by mouth daily.     [provider]  polyethylene glycol (MIRALAX / GLYCOLAX) packet Take 17 g by mouth daily. Mix in 4-8oz of fluid for constipation    [provider]  protein supplement (RESOURCE BENEPROTEIN) POWD Take 6 g by mouth 2 (two) times daily. Mix in 4-6 ounces fluid or other soft food BID for nutritional supplement    [provider]  ranitidine (ZANTAC) 75 MG tablet Take 75 mg by mouth 2 (two) times daily.    [provider]  risperiDONE (RISPERDAL) 0.25 MG tablet Take 0.25 mg by mouth at bedtime.    [provider]  risperiDONE (RISPERDAL) 0.25 MG tablet Take 1 tablet (0.25 mg total) by mouth 2 (two) times daily as needed (agitation). 03/17/18   Alfred Levins, [place], MD  sennosides [facility]) 8.8 MG/5ML syrup Take 10 mLs by mouth 2 (two) times daily as needed.      [provider]  sertraline (ZOLOFT) 50 MG tablet Take 50 mg by mouth daily.     [provider]  simethicone (MYLICON) 80 MG chewable tablet Chew 80 mg by mouth 4 (four) times daily as needed for flatulence.    [provider]  sodium chloride (OCEAN) 0.65 % SOLN nasal spray Place 1 spray into both nostrils every 2 (two) hours as needed. dry nose    [provider]  tamsulosin (  FLOMAX) 0.4 MG CAPS capsule Take 0.4 mg by mouth 2 (two) times daily.    [provider]  traMADol (ULTRAM) 50 MG tablet Take 50 mg by mouth every 4 (four) hours as needed.    [provider]  traMADol (ULTRAM) 50 MG tablet Take 25 mg by mouth 3 (three) times daily. 1/2 tab    [provider]    Allergies Baycol  [cerivastatin] and Zocor  [simvastatin]  Family History  Problem Relation Age of Onset  . Aneurysm Mother   . Stroke Father   . Lymphoma Sister   . Cancer Neg Hx        Bladder, [place] and Prostate  . Prostate cancer Neg Hx     Social History Social History   Tobacco Use  . Smoking status: Former Smoker    Years: 30.00    Types: Cigarettes    Last attempt to quit: 11/15/1968    Years since quitting: 49.3  . Smokeless tobacco: Never Used  Substance Use Topics  . Alcohol use: No    Frequency: Never  . Drug use: No    Review of Systems Constitutional: Negative for fever. Eyes: Negative for visual changes. ENT: Negative for facial injury or neck injury Cardiovascular: Negative for chest injury. Respiratory: Negative for shortness of breath. Negative for chest wall injury. Gastrointestinal: Negative for abdominal pain or injury. Genitourinary: Negative for dysuria. Musculoskeletal: Negative for back injury, negative for arm or leg pain. Skin: Negative for laceration/abrasions. Neurological: Negative for head injury.  ____________________________________________   PHYSICAL EXAM:  VITAL SIGNS: ED Triage Vitals [03/17/18  0722]  Enc Vitals Group     BP (!) 185/104     Pulse Rate (!) 102     Resp 18     Temp 98.2 F (36.8 C)     Temp Source Oral     SpO2 98 %     Weight 165 lb (74.8 kg)     Height 6' (1.829 m)     Head Circumference      Peak Flow      Pain Score      Pain Loc      Pain Edu?      Excl. in [place]?    Full spinal precautions maintained throughout the trauma exam. Constitutional: Alert and oriented x 2. No acute distress. Does not appear intoxicated. HEENT Head: Normocephalic and atraumatic. Face: No facial bony tenderness. Stable midface Ears: No hemotympanum bilaterally. No Battle sign Eyes: No eye injury. PERRL. No raccoon eyes Nose: Nontender. No epistaxis. No rhinorrhea Mouth/Throat: Mucous membranes are moist. No oropharyngeal blood. No dental injury. Airway patent without stridor. Normal voice. Neck: no C-collar in place. No midline c-spine tenderness.  Cardiovascular: Normal rate, regular rhythm. Normal and symmetric distal pulses are present in all extremities. Pulmonary/Chest: Chest wall is stable and nontender to palpation/compression. Normal respiratory effort. Breath sounds are normal. No crepitus.  Abdominal: Soft, nontender, non distended. Foley catheter in place Musculoskeletal: Nontender with normal full range of motion in all extremities. No deformities. No thoracic or lumbar midline spinal tenderness. Pelvis is stable. Skin: Skin is warm, dry and intact. No abrasions or contutions. Psychiatric: Speech and behavior are appropriate. Neurological: Normal speech and language. Moves all extremities to command. No gross focal neurologic deficits are appreciated.  Glascow Coma Score: 4 - Opens eyes on own 6 - Follows simple motor commands 4 - Seems confused, disoriented GCS: 14  ____________________________________________   LABS (all labs ordered are listed,  but only abnormal results are displayed)  Labs Reviewed  CBC WITH DIFFERENTIAL/PLATELET - Abnormal; Notable  for the following components:      Result Value   RBC 4.35 (*)    Hemoglobin 12.5 (*)    HCT 38.1 (*)    RDW 16.2 (*)    All other components within normal limits  BASIC METABOLIC PANEL - Abnormal; Notable for the following components:   Glucose, Bld 139 (*)    BUN 31 (*)    Creatinine, Ser 1.58 (*)    Calcium 10.4 (*)    GFR calc non Af Amer 36 (*)    GFR calc Af Amer 42 (*)    All other components within normal limits  URINALYSIS, COMPLETE (UACMP) WITH MICROSCOPIC - Abnormal; Notable for the following components:   Color, Urine YELLOW (*)    APPearance HAZY (*)    Protein, ur 100 (*)    All other components within normal limits   ____________________________________________  EKG  ED ECG REPORT I, Rudene Re, the attending physician, personally viewed and interpreted this ECG.  Normal sinus rhythm, rate of 98, right bundle branch block, LAFB, LVH, left axis deviation, no ST elevations or depressions.  Unchanged from prior. ____________________________________________  RADIOLOGY  I have personally reviewed the images performed during this visit and I agree with the Radiologist's read.   Interpretation by Radiologist:  Dg Thoracic Spine 2 View  Result Date: 03/17/2018 CLINICAL DATA:  Unstable T12 vertebral fracture. Subsequent encounter. EXAM: THORACIC SPINE 2 VIEWS COMPARISON:  CT on 03/09/2018 FINDINGS: A compression fracture of the T12 vertebral body is seen which shows increased sclerosis since previous study, consistent with early healing. Alignment is normal. No other fractures or focal bone lesions identified. IMPRESSION: Compression fracture of the T12 vertebral body shows early signs of healing. No evidence of subluxation. Electronically Signed   By: Earle Gell M.D.   On: 03/17/2018 08:18   Ct Head Wo Contrast  Result Date: 03/17/2018 CLINICAL DATA:  Unwitnessed fall EXAM: CT HEAD WITHOUT CONTRAST CT CERVICAL SPINE WITHOUT CONTRAST TECHNIQUE: Multidetector CT  imaging of the head and cervical spine was performed following the standard protocol without intravenous contrast. Multiplanar CT image reconstructions of the cervical spine were also generated. COMPARISON:  02/01/2018 FINDINGS: CT HEAD FINDINGS Brain: There is atrophy and chronic small vessel disease changes. No acute intracranial abnormality. Specifically, no hemorrhage, hydrocephalus, mass lesion, acute infarction, or significant intracranial injury. Vascular: No hyperdense vessel or unexpected calcification. Skull: No acute calvarial abnormality. Sinuses/Orbits: Mucosal thickening in the right maxillary sinus. No air-fluid levels. Mastoid air cells clear. Other: None CT CERVICAL SPINE FINDINGS Alignment: Normal Skull base and vertebrae: No fracture Soft tissues and spinal canal: Prevertebral soft tissues are normal. No epidural or paraspinal hematoma. Disc levels: Diffuse advanced degenerative disc disease and facet disease bilaterally. Bilateral multilevel neural foraminal narrowing. Upper chest: Biapical scarring.  No acute findings. Other: No acute findings.  Carotid artery calcifications. IMPRESSION: No acute intracranial abnormality. Atrophy, chronic microvascular disease. Diffuse degenerative disc and facet disease. No acute bony abnormality in the cervical spine. Electronically Signed   By: Rolm Baptise M.D.   On: 03/17/2018 08:38   Ct Cervical Spine Wo Contrast  Result Date: 03/17/2018 CLINICAL DATA:  Unwitnessed fall EXAM: CT HEAD WITHOUT CONTRAST CT CERVICAL SPINE WITHOUT CONTRAST TECHNIQUE: Multidetector CT imaging of the head and cervical spine was performed following the standard protocol without intravenous contrast. Multiplanar CT image reconstructions of the cervical spine were also generated.  COMPARISON:  02/01/2018 FINDINGS: CT HEAD FINDINGS Brain: There is atrophy and chronic small vessel disease changes. No acute intracranial abnormality. Specifically, no hemorrhage, hydrocephalus, mass  lesion, acute infarction, or significant intracranial injury. Vascular: No hyperdense vessel or unexpected calcification. Skull: No acute calvarial abnormality. Sinuses/Orbits: Mucosal thickening in the right maxillary sinus. No air-fluid levels. Mastoid air cells clear. Other: None CT CERVICAL SPINE FINDINGS Alignment: Normal Skull base and vertebrae: No fracture Soft tissues and spinal canal: Prevertebral soft tissues are normal. No epidural or paraspinal hematoma. Disc levels: Diffuse advanced degenerative disc disease and facet disease bilaterally. Bilateral multilevel neural foraminal narrowing. Upper chest: Biapical scarring.  No acute findings. Other: No acute findings.  Carotid artery calcifications. IMPRESSION: No acute intracranial abnormality. Atrophy, chronic microvascular disease. Diffuse degenerative disc and facet disease. No acute bony abnormality in the cervical spine. Electronically Signed   By: Rolm Baptise M.D.   On: 03/17/2018 08:38     ____________________________________________   PROCEDURES  Procedure(s) performed: None Procedures Critical Care performed:  None ____________________________________________   INITIAL IMPRESSION / ASSESSMENT AND PLAN / ED COURSE   84 y.o. male with a history of hypothyroidism, hypertension, CAD, CVA, traumatic subarachnoid hemorrhage, unstable unhealed T12 fracture who presents for evaluation of a fall.  Patient is currently at his baseline.  Reports any his head on a nightstand.  He has no pain.  Patient was rolled with no CT and L-spine tenderness, no step-offs or deformities.  Due to the nature of this unstable T12 fracture and a fall this morning I will send patient for x-rays of his thoracic spine.  Discussed radiology the patient is not supposed to stand up and the x-rays will be taken with patient laying flat.  We will also do head CT and C-spine to rule out acute injuries.  Will check basic labs to rule out electrolyte abnormalities,  dehydration, or worsening UTI.    _________________________ 10:18 AM on 03/17/2018 -----------------------------------------  Labs, CT head and cervical spine, and EKG showing no acute findings.  UA with no evidence of UTI.  Patient's son is requesting a PRN medication for agitation.  I spoke with the facility and they said they only had a prescription for Haldol IV which they feel uncomfortable giving to the patient.  Patient is already on Risperdal at bedtime so I will do a twice daily PRN dose of Risperdal 0.25 mg for agitation.  Prescription was provided.  Discussed continuing bed bound recommendations and follow-up with patient's surgeon next week.   As part of my medical decision making, I reviewed the following data within the [facility] notes reviewed and incorporated, Labs reviewed , EKG interpreted , Old chart reviewed, Radiograph reviewed , Notes from prior ED visits and [place] Controlled Substance Database    Pertinent labs & imaging results that were available during my care of the patient were reviewed by me and considered in my medical decision making (see chart for details).    ____________________________________________   FINAL CLINICAL IMPRESSION(S) / ED DIAGNOSES  Final diagnoses:  Fall, initial encounter      NEW MEDICATIONS STARTED DURING THIS VISIT:  ED Discharge Orders        Ordered    risperiDONE (RISPERDAL) 0.25 MG tablet  2 times daily PRN     03/17/18 1017       Note:  This document was prepared using Dragon voice recognition software and may include unintentional dictation errors.    Alfred Levins, [place], MD 03/17/18 1019
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
## 2018-03-17 NOTE — Discharge Instructions (Signed)

## 2018-03-19 ENCOUNTER — Other Ambulatory Visit
Admission: RE | Admit: 2018-03-19 | Discharge: 2018-03-19 | Disposition: A | Discharge status: Home / Self Care | Payer: Medicare Other | Source: Skilled Nursing Facility | Attending: Gerontology | Admitting: Gerontology

## 2018-03-19 DIAGNOSIS — S22082G Unstable burst fracture of T11-T12 vertebra, subsequent encounter for fracture with delayed healing: Secondary | ICD-10-CM | POA: Diagnosis not present

## 2018-03-19 DIAGNOSIS — X58XXXD Exposure to other specified factors, subsequent encounter: Secondary | ICD-10-CM | POA: Diagnosis not present

## 2018-03-19 DIAGNOSIS — R451 Restlessness and agitation: Secondary | ICD-10-CM | POA: Insufficient documentation

## 2018-03-19 DIAGNOSIS — R410 Disorientation, unspecified: Secondary | ICD-10-CM | POA: Diagnosis present

## 2018-03-19 LAB — CBC WITH DIFFERENTIAL/PLATELET
BASOS ABS: 0.1 10*3/uL (ref 0–0.1)
Basophils Relative: 1 %
Eosinophils Absolute: 0.1 10*3/uL (ref 0–0.7)
Eosinophils Relative: 1 %
HEMATOCRIT: 37.1 % — AB (ref 40.0–52.0)
Hemoglobin: 12.2 g/dL — ABNORMAL LOW (ref 13.0–18.0)
LYMPHS PCT: 15 %
Lymphs Abs: 1.5 10*3/uL (ref 1.0–3.6)
MCH: 28.8 pg (ref 26.0–34.0)
MCHC: 32.9 g/dL (ref 32.0–36.0)
MCV: 87.6 fL (ref 80.0–100.0)
MONO ABS: 1.2 10*3/uL — AB (ref 0.2–1.0)
Monocytes Relative: 12 %
NEUTROS ABS: 7.2 10*3/uL — AB (ref 1.4–6.5)
Neutrophils Relative %: 71 %
Platelets: 323 10*3/uL (ref 150–440)
RBC: 4.23 MIL/uL — AB (ref 4.40–5.90)
RDW: 16.4 % — ABNORMAL HIGH (ref 11.5–14.5)
WBC: 10.1 10*3/uL (ref 3.8–10.6)

## 2018-03-19 LAB — URINALYSIS, COMPLETE (UACMP) WITH MICROSCOPIC
Bilirubin Urine: NEGATIVE
Glucose, UA: NEGATIVE mg/dL
HGB URINE DIPSTICK: NEGATIVE
Ketones, ur: NEGATIVE mg/dL
Leukocytes, UA: NEGATIVE
Nitrite: NEGATIVE
Protein, ur: 100 mg/dL — AB
SPECIFIC GRAVITY, URINE: 1.02 (ref 1.005–1.030)
Squamous Epithelial / LPF: NONE SEEN (ref 0–5)
pH: 5 (ref 5.0–8.0)

## 2018-03-19 LAB — COMPREHENSIVE METABOLIC PANEL
ALT: 10 U/L — ABNORMAL LOW (ref 17–63)
ANION GAP: 8 (ref 5–15)
AST: 19 U/L (ref 15–41)
Albumin: 3 g/dL — ABNORMAL LOW (ref 3.5–5.0)
Alkaline Phosphatase: 68 U/L (ref 38–126)
BUN: 41 mg/dL — ABNORMAL HIGH (ref 6–20)
CHLORIDE: 101 mmol/L (ref 101–111)
CO2: 27 mmol/L (ref 22–32)
Calcium: 10.3 mg/dL (ref 8.9–10.3)
Creatinine, Ser: 1.41 mg/dL — ABNORMAL HIGH (ref 0.61–1.24)
GFR, EST AFRICAN AMERICAN: 48 mL/min — AB (ref >60)
GFR, EST NON AFRICAN AMERICAN: 42 mL/min — AB (ref >60)
Glucose, Bld: 152 mg/dL — ABNORMAL HIGH (ref 65–99)
Potassium: 3.2 mmol/L — ABNORMAL LOW (ref 3.5–5.1)
Sodium: 136 mmol/L (ref 135–145)
Total Bilirubin: 0.5 mg/dL (ref 0.3–1.2)
Total Protein: 6.5 g/dL (ref 6.5–8.1)

## 2018-03-20 ENCOUNTER — Non-Acute Institutional Stay (SKILLED_NURSING_FACILITY): Payer: Medicare Other | Admitting: Gerontology

## 2018-03-20 DIAGNOSIS — K567 Ileus, unspecified: Secondary | ICD-10-CM

## 2018-03-21 ENCOUNTER — Other Ambulatory Visit
Admission: RE | Admit: 2018-03-21 | Discharge: 2018-03-21 | Disposition: A | Discharge status: Home / Self Care | Payer: Medicare Other | Source: Ambulatory Visit | Attending: Gerontology | Admitting: Gerontology

## 2018-03-21 DIAGNOSIS — S22082G Unstable burst fracture of T11-T12 vertebra, subsequent encounter for fracture with delayed healing: Secondary | ICD-10-CM | POA: Diagnosis present

## 2018-03-21 LAB — CBC WITH DIFFERENTIAL/PLATELET
BASOS ABS: 0 10*3/uL (ref 0–0.1)
BASOS PCT: 1 %
Eosinophils Absolute: 0.1 10*3/uL (ref 0–0.7)
Eosinophils Relative: 1 %
HCT: 35.7 % — ABNORMAL LOW (ref 40.0–52.0)
HEMOGLOBIN: 11.9 g/dL — AB (ref 13.0–18.0)
Lymphocytes Relative: 14 %
Lymphs Abs: 1.4 10*3/uL (ref 1.0–3.6)
MCH: 29.3 pg (ref 26.0–34.0)
MCHC: 33.3 g/dL (ref 32.0–36.0)
MCV: 87.9 fL (ref 80.0–100.0)
MONOS PCT: 11 %
Monocytes Absolute: 1.1 10*3/uL — ABNORMAL HIGH (ref 0.2–1.0)
NEUTROS PCT: 73 %
Neutro Abs: 7.2 10*3/uL — ABNORMAL HIGH (ref 1.4–6.5)
Platelets: 315 10*3/uL (ref 150–440)
RBC: 4.07 MIL/uL — ABNORMAL LOW (ref 4.40–5.90)
RDW: 16.6 % — AB (ref 11.5–14.5)
WBC: 9.8 10*3/uL (ref 3.8–10.6)

## 2018-03-21 LAB — URINE CULTURE: CULTURE: NO GROWTH

## 2018-03-21 LAB — BASIC METABOLIC PANEL
Anion gap: 9 (ref 5–15)
BUN: 51 mg/dL — ABNORMAL HIGH (ref 6–20)
CALCIUM: 10.1 mg/dL (ref 8.9–10.3)
CHLORIDE: 106 mmol/L (ref 101–111)
CO2: 26 mmol/L (ref 22–32)
CREATININE: 1.39 mg/dL — AB (ref 0.61–1.24)
GFR calc non Af Amer: 42 mL/min — ABNORMAL LOW (ref >60)
GFR, EST AFRICAN AMERICAN: 49 mL/min — AB (ref >60)
Glucose, Bld: 102 mg/dL — ABNORMAL HIGH (ref 65–99)
Potassium: 2.9 mmol/L — ABNORMAL LOW (ref 3.5–5.1)
SODIUM: 141 mmol/L (ref 135–145)

## 2018-03-22 ENCOUNTER — Other Ambulatory Visit
Admission: RE | Admit: 2018-03-22 | Discharge: 2018-03-22 | Disposition: A | Discharge status: Home / Self Care | Payer: Medicare Other | Source: Ambulatory Visit | Attending: Internal Medicine | Admitting: Internal Medicine

## 2018-03-22 ENCOUNTER — Other Ambulatory Visit
Admission: RE | Admit: 2018-03-22 | Discharge: 2018-03-22 | Disposition: A | Discharge status: Home / Self Care | Payer: Medicare Other | Source: Ambulatory Visit | Attending: Gerontology | Admitting: Gerontology

## 2018-03-22 DIAGNOSIS — D649 Anemia, unspecified: Secondary | ICD-10-CM | POA: Diagnosis present

## 2018-03-22 LAB — CBC WITH DIFFERENTIAL/PLATELET
BASOS PCT: 1 %
Basophils Absolute: 0.1 10*3/uL (ref 0–0.1)
EOS ABS: 0.1 10*3/uL (ref 0–0.7)
Eosinophils Relative: 2 %
HCT: 34.9 % — ABNORMAL LOW (ref 40.0–52.0)
Hemoglobin: 11.7 g/dL — ABNORMAL LOW (ref 13.0–18.0)
Lymphocytes Relative: 18 %
Lymphs Abs: 1.4 10*3/uL (ref 1.0–3.6)
MCH: 29.1 pg (ref 26.0–34.0)
MCHC: 33.4 g/dL (ref 32.0–36.0)
MCV: 87 fL (ref 80.0–100.0)
MONO ABS: 0.8 10*3/uL (ref 0.2–1.0)
MONOS PCT: 10 %
Neutro Abs: 5.6 10*3/uL (ref 1.4–6.5)
Neutrophils Relative %: 69 %
PLATELETS: 296 10*3/uL (ref 150–440)
RBC: 4.01 MIL/uL — ABNORMAL LOW (ref 4.40–5.90)
RDW: 16.1 % — AB (ref 11.5–14.5)
WBC: 8 10*3/uL (ref 3.8–10.6)

## 2018-03-23 LAB — BASIC METABOLIC PANEL
ANION GAP: 8 (ref 5–15)
BUN: 47 mg/dL — AB (ref 6–20)
CHLORIDE: 110 mmol/L (ref 101–111)
CO2: 26 mmol/L (ref 22–32)
Calcium: 10.4 mg/dL — ABNORMAL HIGH (ref 8.9–10.3)
Creatinine, Ser: 1.06 mg/dL (ref 0.61–1.24)
GFR calc Af Amer: >60 mL/min (ref >60)
GFR, EST NON AFRICAN AMERICAN: 59 mL/min — AB (ref >60)
Glucose, Bld: 74 mg/dL (ref 65–99)
POTASSIUM: 3.2 mmol/L — AB (ref 3.5–5.1)
SODIUM: 144 mmol/L (ref 135–145)

## 2018-03-24 ENCOUNTER — Other Ambulatory Visit
Admission: RE | Admit: 2018-03-24 | Discharge: 2018-03-24 | Disposition: A | Discharge status: Home / Self Care | Payer: Medicare Other | Source: Ambulatory Visit | Attending: Internal Medicine | Admitting: Internal Medicine

## 2018-03-24 DIAGNOSIS — R410 Disorientation, unspecified: Secondary | ICD-10-CM | POA: Insufficient documentation

## 2018-03-24 LAB — CBC WITH DIFFERENTIAL/PLATELET
Basophils Absolute: 0.1 10*3/uL (ref 0–0.1)
Basophils Relative: 1 %
EOS ABS: 0.6 10*3/uL (ref 0–0.7)
Eosinophils Relative: 8 %
HCT: 35.1 % — ABNORMAL LOW (ref 40.0–52.0)
HEMOGLOBIN: 11.6 g/dL — AB (ref 13.0–18.0)
LYMPHS ABS: 1.5 10*3/uL (ref 1.0–3.6)
Lymphocytes Relative: 20 %
MCH: 28.8 pg (ref 26.0–34.0)
MCHC: 33.1 g/dL (ref 32.0–36.0)
MCV: 86.9 fL (ref 80.0–100.0)
Monocytes Absolute: 0.7 10*3/uL (ref 0.2–1.0)
Monocytes Relative: 10 %
NEUTROS PCT: 63 %
Neutro Abs: 4.9 10*3/uL (ref 1.4–6.5)
Platelets: 297 10*3/uL (ref 150–440)
RBC: 4.04 MIL/uL — AB (ref 4.40–5.90)
RDW: 15.5 % — ABNORMAL HIGH (ref 11.5–14.5)
WBC: 7.8 10*3/uL (ref 3.8–10.6)

## 2018-03-24 LAB — BASIC METABOLIC PANEL
Anion gap: 9 (ref 5–15)
BUN: 37 mg/dL — AB (ref 6–20)
CHLORIDE: 110 mmol/L (ref 101–111)
CO2: 24 mmol/L (ref 22–32)
Calcium: 9.9 mg/dL (ref 8.9–10.3)
Creatinine, Ser: 1.17 mg/dL (ref 0.61–1.24)
GFR calc non Af Amer: 52 mL/min — ABNORMAL LOW (ref >60)
Glucose, Bld: 58 mg/dL — ABNORMAL LOW (ref 65–99)
POTASSIUM: 2.8 mmol/L — AB (ref 3.5–5.1)
SODIUM: 143 mmol/L (ref 135–145)

## 2018-03-25 ENCOUNTER — Other Ambulatory Visit
Admission: RE | Admit: 2018-03-25 | Discharge: 2018-03-25 | Disposition: A | Discharge status: Home / Self Care | Payer: Medicare Other | Source: Skilled Nursing Facility | Attending: Gerontology | Admitting: Gerontology

## 2018-03-25 DIAGNOSIS — K567 Ileus, unspecified: Secondary | ICD-10-CM | POA: Diagnosis present

## 2018-03-25 LAB — BASIC METABOLIC PANEL
Anion gap: 8 (ref 5–15)
BUN: 27 mg/dL — ABNORMAL HIGH (ref 6–20)
CHLORIDE: 111 mmol/L (ref 101–111)
CO2: 24 mmol/L (ref 22–32)
Calcium: 10.1 mg/dL (ref 8.9–10.3)
Creatinine, Ser: 0.81 mg/dL (ref 0.61–1.24)
GFR calc Af Amer: >60 mL/min (ref >60)
GFR calc non Af Amer: >60 mL/min (ref >60)
GLUCOSE: 74 mg/dL (ref 65–99)
POTASSIUM: 3.8 mmol/L (ref 3.5–5.1)
Sodium: 143 mmol/L (ref 135–145)

## 2018-03-25 LAB — CBC WITH DIFFERENTIAL/PLATELET
Basophils Absolute: 0 10*3/uL (ref 0–0.1)
Basophils Relative: 0 %
Eosinophils Absolute: 0.5 10*3/uL (ref 0–0.7)
Eosinophils Relative: 6 %
HCT: 37.3 % — ABNORMAL LOW (ref 40.0–52.0)
HEMOGLOBIN: 12.5 g/dL — AB (ref 13.0–18.0)
LYMPHS ABS: 1.6 10*3/uL (ref 1.0–3.6)
LYMPHS PCT: 19 %
MCH: 29.2 pg (ref 26.0–34.0)
MCHC: 33.6 g/dL (ref 32.0–36.0)
MCV: 86.9 fL (ref 80.0–100.0)
Monocytes Absolute: 0.7 10*3/uL (ref 0.2–1.0)
Monocytes Relative: 8 %
NEUTROS ABS: 5.3 10*3/uL (ref 1.4–6.5)
NEUTROS PCT: 67 %
Platelets: 299 10*3/uL (ref 150–440)
RBC: 4.29 MIL/uL — AB (ref 4.40–5.90)
RDW: 15.5 % — ABNORMAL HIGH (ref 11.5–14.5)
WBC: 8.1 10*3/uL (ref 3.8–10.6)

## 2018-03-28 ENCOUNTER — Encounter: Payer: Self-pay | Admitting: Gerontology

## 2018-03-28 ENCOUNTER — Non-Acute Institutional Stay (SKILLED_NURSING_FACILITY): Payer: Medicare Other | Admitting: Gerontology

## 2018-03-28 DIAGNOSIS — S22082G Unstable burst fracture of T11-T12 vertebra, subsequent encounter for fracture with delayed healing: Secondary | ICD-10-CM | POA: Diagnosis not present

## 2018-03-28 DIAGNOSIS — R404 Transient alteration of awareness: Secondary | ICD-10-CM | POA: Diagnosis not present

## 2018-03-28 DIAGNOSIS — Z515 Encounter for palliative care: Secondary | ICD-10-CM
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
## 2018-03-31 ENCOUNTER — Encounter: Payer: Self-pay | Admitting: Gerontology

## 2018-03-31 ENCOUNTER — Other Ambulatory Visit: Payer: Self-pay

## 2018-03-31 ENCOUNTER — Non-Acute Institutional Stay (SKILLED_NURSING_FACILITY): Payer: Medicare Other | Admitting: Gerontology

## 2018-03-31 DIAGNOSIS — Z515 Encounter for palliative care: Secondary | ICD-10-CM | POA: Diagnosis not present

## 2018-03-31 DIAGNOSIS — I679 Cerebrovascular disease, unspecified: Secondary | ICD-10-CM

## 2018-03-31 MED ORDER — MORPHINE SULFATE (CONCENTRATE) 20 MG/ML PO SOLN: 5.0000 mg | ORAL | 0 refills | Status: AC | PRN
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
## 2018-03-31 NOTE — Telephone Encounter (Signed)
Rx sent to [facility] phone : [phone] , fax : [phone]  

## 2018-04-05 DIAGNOSIS — I679 Cerebrovascular disease, unspecified: Secondary | ICD-10-CM | POA: Insufficient documentation

## 2018-04-15 DEATH — deceased

## 2018-04-27 ENCOUNTER — Ambulatory Visit: Payer: Medicare Other | Admitting: Urology

## 2018-05-10 NOTE — Progress Notes (Signed)
Location:      Place of Service:  SNF (31) Provider:  Toni Arthurs, NP-C  [name], MD  Patient Care Team: [name], MD as PCP - General [facility] Medicine)  Extended Emergency Contact Information Primary Emergency Contact: Marissa Nestle of [place] Phone: [phone] Relation: Son Secondary Emergency Contact: Karn Cassis Address: [address], [place] [id number] [name] of [place] Phone: [phone] Relation: Daughter  Code Status: DNR Goals of care: Advanced Directive information Advanced Directives 03/31/2018  Does Patient Have a Medical Advance Directive? Yes  Type of Advance Directive Out of facility DNR (pink MOST or yellow form)  Does patient want to make changes to medical advance directive? No - Patient declined  Copy of [place] in Chart? -  Would patient like information on creating a medical advance directive? -  Pre-existing out of facility DNR order (yellow form or pink MOST form) Yellow form placed in chart (order not valid for inpatient use)     Chief Complaint  Patient presents with  . Acute Visit    Abd pain    HPI:  Pt is a 84 y.o. male seen today for an acute visit for abdominal pain and distention.  Nursing staff noted patient was having increased confusion on previous shift.  No complaints of pain at that time.  During day shift, patient has been lethargic and having snoring-like breathing through an open mouth.  Minimal p.o. intake.  Daughter verbalized concern about his abdomen appearing distended and taut.  Upon assessment, patient is more lethargic than usual, but arousable.  Patient verbally denies pain or discomfort.  However, grimaced with light palpation of the abdomen.  Bowel sounds hypoactive/tympanic.  Abdomen is slightly distended and taut.  Staff reports patient has been having liquid stools.  Patient is afebrile.  Denies nausea and vomiting.  Vital signs stable  except mildly tachycardic.  Labs reviewed, patient is mildly hypokalemic.  Will supplement.  Will obtain abdominal x-rays due to concern for ileus/obstruction.  Otherwise, no other complaints.    Past Medical History:  Diagnosis Date  . Acquired hypothyroidism   . Anxiety and depression   . Benign essential tremor   . Borderline diabetes mellitus   . BPH (benign prostatic hyperplasia)   . Coronary artery disease   . Depression   . Essential hypertension   . GERD (gastroesophageal reflux disease)   . History of CVA (cerebrovascular accident) 11/2013  . Hypertension   . Incomplete bladder emptying   . Obstructive sleep apnea   . Panic attacks   . Peptic ulcer disease   . Pure hypercholesterolemia   . Skin cancer    In remission for several years; bianual dermatologist apponitments  . Sleep apnea   . Stomach ulcer   . TIA (transient ischemic attack)    Past Surgical History:  Procedure Laterality Date  . CARDIAC SURGERY  2005  . [place]  . CAROTID ENDARTERECTOMY    . COLONOSCOPY  11/11/2008   97m polyp in sigmoid colon, internal non-bleeding small hemorrhoids, diverticulosis in sigmoid, descending, and transverse colon  . CORONARY ARTERY BYPASS GRAFT    . ESOPHAGOGASTRODUODENOSCOPY (EGD) WITH PROPOFOL N/A 11/22/2017   Procedure: ESOPHAGOGASTRODUODENOSCOPY (EGD) WITH PROPOFOL;  Surgeon: AJonathon Bellows MD;  [facility];  Service: Gastroenterology;  Laterality: N/A;  . SQUAMOUS CELL CARCINOMA EXCISION     removal from nose    Allergies  Allergen Reactions  . Baycol  [Cerivastatin]     Other reaction(s): Muscle Pain muscle pain  . Zocor  [Simvastatin]     Other reaction(s): Muscle Pain muscle pain    Allergies as of 03/20/2018      Reactions   Baycol  [cerivastatin]    Other reaction(s): Muscle Pain muscle pain   Zocor  [simvastatin]    Other reaction(s): Muscle Pain muscle pain      Medication List        Accurate as of 03/20/18 11:59 PM.  Always use your most recent med list.          acetaminophen 325 MG tablet Commonly known as:  TYLENOL Take 975 mg by mouth 3 (three) times daily.   acetaminophen 650 MG suppository Commonly known as:  TYLENOL Place 650 mg rectally every 4 (four) hours as needed. Give 1 suppository Q 4 hours for fever if unable to take med PO. Maximum dose for 24 hours is 3,000 mg for all sources of Acetaminophen/ Tylenol   amLODipine 10 MG tablet Commonly known as:  NORVASC Take 10 mg by mouth daily.   ASPERCREME LIDOCAINE 4 % Ptch Generic drug:  Lidocaine Apply 1 patch topically daily. Apply patch to the level of T12 for pain (r/t fracture). Remove patch after 12 hours.   aspirin 325 MG EC tablet Take 325 mg by mouth 2 (two) times daily.   BIOTENE DRY MOUTH MOISTURIZING MT Use as directed 30 mLs in the mouth or throat 2 (two) times daily. Rinse mouth with 15-30 mL. Swish and spit.  for mouth moisture   BIOTENE MOISTURIZING MOUTH Soln Use as directed 2 sprays in the mouth or throat every 4 (four) hours as needed. for mouth moisture. OK to leave at bedside   Calcipotriene 0.005 % solution Apply topically Qd to BID Monday through Thursday   calcium-vitamin D 500-200 MG-UNIT tablet Commonly known as:  OSCAL WITH D Take 1 tablet by mouth daily.   carvedilol 3.125 MG tablet Commonly known as:  COREG TAKE 1 TABLET TWICE DAILY WITH MEALS   cefTRIAXone IVPB Commonly known as:  ROCEPHIN Inject 1 g into the vein daily. x 10 days for UTI   CEROVITE SENIOR Tabs Take 1 tablet by mouth daily.   Cholecalciferol 4000 units Caps Take 1 capsule by mouth daily.   Clobetasol Prop Emollient Base 0.05 % emollient cream Apply topically to scalp once a  day on Sun, Fri, Sat -as needed for scalp irritation/itching   Co Q 10 100 MG Caps Take 2 capsules by mouth daily.   collagenase ointment Commonly known as:  SANTYL Apply 1 application topically every evening.   feeding supplement (PRO-STAT SUGAR  FREE 64) Liqd Take 30 mLs by mouth 2 (two) times daily. low protein, low albumin, wound healing   finasteride 5 MG tablet Commonly known as:  PROSCAR TAKE ONE TABLET BY MOUTH EVERY DAY   fluticasone 50 MCG/ACT nasal spray Commonly known as:  FLONASE INSTILL 2 SPRAYS INTO EACH NOSTRIL ONCE DAILY   geriatric multivitamins-minerals Liqd Take 15 mLs by mouth 3 (three) times daily with meals.   GLUCERNA Liqd Take 237 mLs by mouth 2 (two) times daily between meals.   haloperidol lactate 5 MG/ML injection Commonly known as:  HALDOL Inject 0.5 mLs (2.5 mg total) into the vein every 6 (six) hours as needed (Agitation).   levothyroxine 88 MCG tablet Commonly known as:  SYNTHROID, LEVOTHROID TAKE 1 TABLET BY MOUTH EVERY DAY  loratadine 10 MG tablet Commonly known as:  CLARITIN Take 10 mg by mouth daily.   losartan 50 MG tablet Commonly known as:  COZAAR TAKE ONE TABLET BY MOUTH TWICE DAILY   Melatonin 3 MG Tabs Take 2 tablets by mouth at bedtime.   methocarbamol 500 MG tablet Commonly known as:  ROBAXIN Take 500 mg by mouth every 6 (six) hours as needed (pain, spasms, cramps).   metoCLOPramide 5 MG tablet Commonly known as:  REGLAN Take 5 mg by mouth 3 (three) times daily.   nitroGLYCERIN 0.4 MG SL tablet Commonly known as:  NITROSTAT 1 under tongue every 5 minutes as needed for chest pain, up to 3 doses, notify MD   omeprazole 40 MG capsule Commonly known as:  PRILOSEC Take 40 mg by mouth daily.   polycarbophil 625 MG tablet Commonly known as:  FIBERCON Take 1,250 mg by mouth daily.   polyethylene glycol packet Commonly known as:  MIRALAX / GLYCOLAX Take 17 g by mouth daily. Mix in 4-8oz of fluid for constipation   protein supplement Powd Take 6 g by mouth 2 (two) times daily. Mix in 4-6 ounces fluid or other soft food BID for nutritional supplement   ranitidine 75 MG tablet Commonly known as:  ZANTAC Take 75 mg by mouth 2 (two) times daily.   RISPERDAL 0.25  MG tablet Generic drug:  risperiDONE Take 0.25 mg by mouth at bedtime.   risperiDONE 0.25 MG tablet Commonly known as:  RISPERDAL Take 1 tablet (0.25 mg total) by mouth 2 (two) times daily as needed (agitation).   sennosides 8.8 MG/5ML syrup Commonly known as:  SENOKOT Take 10 mLs by mouth 2 (two) times daily as needed.   sertraline 50 MG tablet Commonly known as:  ZOLOFT Take 50 mg by mouth daily.   simethicone 80 MG chewable tablet Commonly known as:  MYLICON Chew 80 mg by mouth 4 (four) times daily as needed for flatulence.   sodium chloride 0.65 % Soln nasal spray Commonly known as:  OCEAN Place 1 spray into both nostrils every 2 (two) hours as needed. dry nose   tamsulosin 0.4 MG Caps capsule Commonly known as:  FLOMAX Take 0.4 mg by mouth 2 (two) times daily.   traMADol 50 MG tablet Commonly known as:  ULTRAM Take 50 mg by mouth every 4 (four) hours as needed.   traMADol 50 MG tablet Commonly known as:  ULTRAM Take 25 mg by mouth 3 (three) times daily. 1/2 tab   ZOFRAN 4 MG tablet Generic drug:  ondansetron Take 4 mg by mouth every 4 (four) hours as needed for nausea or vomiting.       Review of Systems  Unable to perform ROS: Mental status change  Constitutional: Positive for fatigue. Negative for activity change, appetite change, chills, diaphoresis and fever.  HENT: Negative for congestion, mouth sores, nosebleeds, postnasal drip, sneezing, sore throat, trouble swallowing and voice change.   Respiratory: Negative for apnea, cough, choking, chest tightness, shortness of breath and wheezing.   Cardiovascular: Negative for chest pain, palpitations and leg swelling.  Gastrointestinal: Positive for abdominal distention and diarrhea. Negative for abdominal pain, constipation, nausea and vomiting.  Genitourinary: Negative for difficulty urinating, dysuria, frequency and urgency.  Musculoskeletal: Negative for back pain, gait problem and myalgias. Arthralgias:  typical arthritis.  Skin: Negative for color change, pallor, rash and wound.  Neurological: Negative for dizziness, tremors, syncope, speech difficulty, weakness, numbness and headaches.  Psychiatric/Behavioral: Positive for confusion. Negative for agitation and  behavioral problems.  All other systems reviewed and are negative.   Immunization History  Administered Date(s) Administered  . Influenza,inj,quad, With Preservative 07/17/2015  . Influenza-Unspecified 08/10/2012, 08/24/2013, 08/01/2014, 08/05/2015, 07/13/2016, 08/08/2017  . Pneumococcal Conjugate-13 10/04/2014, 12/13/2014  . Pneumococcal Polysaccharide-23 04/02/1999  . Td 04/02/1999  . Tdap 03/28/2017, 01/12/2018   There are no preventive care reminders to display for this patient. Fall Risk  04/20/2017  Falls in the past year? No   Functional Status Survey:    Vitals:   03/20/18 0845  BP: (!) 169/78  Pulse: (!) 107  Temp: 98.1 F (36.7 C)  SpO2: 98%   There is no height or weight on file to calculate BMI. Physical Exam  Constitutional: He is oriented to person, place, and time. He appears well-developed and well-nourished. He appears lethargic. He is sleeping and cooperative. He does not appear ill. No distress.  HENT:  Head: Normocephalic and atraumatic.  Mouth/Throat: Uvula is midline, oropharynx is clear and moist and mucous membranes are normal. Mucous membranes are not pale, not dry and not cyanotic.  Eyes: Pupils are equal, round, and reactive to light. Conjunctivae, EOM and lids are normal.  Neck: Trachea normal, normal range of motion and full passive range of motion without pain. Neck supple. No JVD present. No tracheal deviation, no edema and no erythema present. No thyromegaly present.  Cardiovascular: Normal rate, regular rhythm, normal heart sounds, intact distal pulses and normal pulses. Exam reveals no gallop, no distant heart sounds and no friction rub.  No murmur heard. Pulses:      Dorsalis pedis  pulses are 2+ on the right side, and 2+ on the left side.  No edema  Pulmonary/Chest: Effort normal and breath sounds normal. No accessory muscle usage. No respiratory distress. He has no decreased breath sounds. He has no wheezes. He has no rhonchi. He has no rales. He exhibits no tenderness.  Abdominal: He exhibits distension. He exhibits no ascites. Bowel sounds are decreased. There is generalized tenderness. There is no rigidity and no guarding. No hernia.  Musculoskeletal: Normal range of motion. He exhibits no edema or tenderness.  Expected osteoarthritis, stiffness; Bilateral Calves soft, supple. Negative Homan's Sign. B- pedal pulses equal, generalized weakness, T12 fracture  Neurological: He is oriented to person, place, and time. He has normal strength. He appears lethargic. He exhibits abnormal muscle tone. Coordination and gait abnormal.  Skin: Skin is warm, dry and intact. He is not diaphoretic. No cyanosis. No pallor. Nails show no clubbing.  Psychiatric: He has a normal mood and affect. His speech is normal and behavior is normal. Judgment and thought content normal. Cognition and memory are impaired.  Nursing note and vitals reviewed.   Labs reviewed: Recent Labs    02/20/18 0510  03/22/18 1500 03/24/18 0515 03/25/18 0722  NA 138   < > 144 143 143  K 4.0   < > 3.2* 2.8* 3.8  CL 105   < > 110 110 111  CO2 27   < > 26 24 24   GLUCOSE 83   < > 74 58* 74  BUN 29*   < > 47* 37* 27*  CREATININE 1.74*   < > 1.06 1.17 0.81  CALCIUM 10.0   < > 10.4* 9.9 10.1  MG 2.1  --   --   --   --    < > = values in this interval not displayed.   Recent Labs    02/20/18 0510 03/14/18 1515 03/19/18 1845  AST 24 23 19   ALT 17 14* 10*  ALKPHOS 81 80 68  BILITOT 0.5 0.2* 0.5  PROT 5.8* 6.1* 6.5  ALBUMIN 3.0* 2.8* 3.0*   Recent Labs    03/22/18 0858 03/24/18 0515 03/25/18 0722  WBC 8.0 7.8 8.1  NEUTROABS 5.6 4.9 5.3  HGB 11.7* 11.6* 12.5*  HCT 34.9* 35.1* 37.3*  MCV 87.0 86.9  86.9  PLT 296 297 299   Lab Results  Component Value Date   TSH 2.165 02/20/2018   No results found for: HGBA1C No results found for: CHOL, HDL, LDLCALC, LDLDIRECT, TRIG, CHOLHDL  Significant Diagnostic Results in last 30 days:  No results found.  Assessment/Plan [place] was seen today for acute visit.  Diagnoses and all orders for this visit:  Ileus ([place])    N.p.o. except meds  May have sips of clear liquids only and ice chips  Hold p.o. medications  Metoclopramide 5 mg/milliliter-5 mg IV every 8 hours x4 days  Potassium chloride 40 mEq p.o. x1 for mild hypokalemia  Insert and maintain peripheral IV for infusion of IV fluids  Sodium chloride 0.9%-give 250 mL bolus over 1 hour, then continuous infusion at 50 mL/hour x72 hours  Palliative care consult for declining health and ongoing family discussions    Family/ staff Communication:   Total Time:  Documentation:  Face to Face:  Family/Phone:    Labs/tests ordered: Abdominal series x-rays, 2 view chest x-ray, CBC, met B in the a.m.  Medication list reviewed and assessed for continued appropriateness.  Vikki Ports, NP-C Geriatrics [facility] [phone], [place] [address] Cell Phone (Mon-Fri 8am-5pm):  [phone] On Call:  [phone] & follow prompts after 5pm & weekends Office Phone:  [phone] Office Fax:  [phone]

## 2018-06-22 ENCOUNTER — Encounter (INDEPENDENT_AMBULATORY_CARE_PROVIDER_SITE_OTHER): Payer: Medicare Other

## 2018-06-22 ENCOUNTER — Other Ambulatory Visit (INDEPENDENT_AMBULATORY_CARE_PROVIDER_SITE_OTHER): Payer: Medicare Other

## 2018-06-23 ENCOUNTER — Other Ambulatory Visit (INDEPENDENT_AMBULATORY_CARE_PROVIDER_SITE_OTHER): Payer: Medicare Other

## 2018-06-23 ENCOUNTER — Encounter (INDEPENDENT_AMBULATORY_CARE_PROVIDER_SITE_OTHER): Payer: Medicare Other

## 2018-06-23 ENCOUNTER — Ambulatory Visit (INDEPENDENT_AMBULATORY_CARE_PROVIDER_SITE_OTHER): Payer: Medicare Other | Admitting: Vascular Surgery

## 2018-10-24 ENCOUNTER — Ambulatory Visit: Payer: Medicare Other | Admitting: Urology
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
